# Patient Record
Sex: Male | Born: 1965 | Race: Black or African American | Hispanic: No | Marital: Single | Smoking: Current every day smoker
Health system: Southern US, Community
[De-identification: ages and names within clinical notes are randomized; demographics above are authoritative.]

## PROBLEM LIST (undated history)

## (undated) DIAGNOSIS — I10 Essential (primary) hypertension: Secondary | ICD-10-CM

---

## 1993-11-04 DIAGNOSIS — S62109A Fracture of unspecified carpal bone, unspecified wrist, initial encounter for closed fracture: Secondary | ICD-10-CM

## 1993-11-04 HISTORY — DX: Fracture of unspecified carpal bone, unspecified wrist, initial encounter for closed fracture: S62.109A

## 2021-09-30 ENCOUNTER — Emergency Department (HOSPITAL_COMMUNITY)
Admission: EM | Admit: 2021-09-30 | Discharge: 2021-10-01 | Disposition: A | Discharge status: Home / Self Care | Payer: Self-pay | Attending: Emergency Medicine | Admitting: Emergency Medicine

## 2021-09-30 DIAGNOSIS — Y9355 Activity, bike riding: Secondary | ICD-10-CM | POA: Insufficient documentation

## 2021-09-30 DIAGNOSIS — S8991XA Unspecified injury of right lower leg, initial encounter: Secondary | ICD-10-CM | POA: Insufficient documentation

## 2021-09-30 DIAGNOSIS — S7011XA Contusion of right thigh, initial encounter: Secondary | ICD-10-CM | POA: Insufficient documentation

## 2021-09-30 DIAGNOSIS — Y92410 Unspecified street and highway as the place of occurrence of the external cause: Secondary | ICD-10-CM | POA: Insufficient documentation

## 2021-10-01 ENCOUNTER — Encounter (HOSPITAL_COMMUNITY): Payer: Self-pay | Admitting: *Deleted

## 2021-10-01 ENCOUNTER — Other Ambulatory Visit: Payer: Self-pay

## 2021-10-01 NOTE — Discharge Instructions (Signed)
Take ibuprofen for pain as needed. Cool compresses may help soreness.
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
## 2021-10-01 NOTE — ED Notes (Signed)
The pt walked out without any assistance or diffficulty

## 2021-12-12 ENCOUNTER — Encounter (HOSPITAL_BASED_OUTPATIENT_CLINIC_OR_DEPARTMENT_OTHER): Payer: Self-pay | Admitting: *Deleted

## 2021-12-12 ENCOUNTER — Emergency Department (HOSPITAL_COMMUNITY)
Admission: EM | Admit: 2021-12-12 | Discharge: 2021-12-12 | Disposition: A | Discharge status: Home / Self Care | Payer: Self-pay | Attending: Emergency Medicine | Admitting: Emergency Medicine

## 2021-12-12 ENCOUNTER — Other Ambulatory Visit: Payer: Self-pay

## 2021-12-12 DIAGNOSIS — S90821A Blister (nonthermal), right foot, initial encounter: Secondary | ICD-10-CM | POA: Insufficient documentation

## 2021-12-12 DIAGNOSIS — R238 Other skin changes: Secondary | ICD-10-CM | POA: Insufficient documentation

## 2021-12-12 DIAGNOSIS — Y9301 Activity, walking, marching and hiking: Secondary | ICD-10-CM | POA: Insufficient documentation

## 2021-12-12 DIAGNOSIS — X58XXXA Exposure to other specified factors, initial encounter: Secondary | ICD-10-CM | POA: Insufficient documentation

## 2021-12-12 DIAGNOSIS — S90822A Blister (nonthermal), left foot, initial encounter: Secondary | ICD-10-CM | POA: Insufficient documentation
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
## 2021-12-12 NOTE — ED Provider Notes (Signed)
°  [facility] Emergency Department Provider Note MRN:  [id number]  Arrival date & time: 12/12/21     Chief Complaint   Blister   History of Present Illness   Charles Patterson is a 56 y.o. year-old male presents to the ED with chief complaint of blisters on the soles of his feet.  He denies hx of DM.  States that he has been walking a lot lately.  Reports that they are painful.  Denies successful treatments PTA.    Review of Systems  Pertinent review of systems noted in HPI.    Physical Exam   Vitals:   12/12/21 0127  BP: (!) 119/46  Pulse: 86  Resp: 18  Temp: 98.9 F (37.2 C)  SpO2: 100%    CONSTITUTIONAL:  well-appearing, NAD NEURO:  Alert and oriented x 3, CN 3-12 grossly intact EYES:  eyes equal and reactive ENT/NECK:  Supple, no stridor  CARDIO:  normal rate, appears well-perfused  PULM:  No respiratory distress,  GI/GU:  non-distended,  MSK/SPINE:  No gross deformities, no edema, moves all extremities  SKIN:  no rash, atraumatic, 2 penny sized blisters, one on each foot on the ball of the foot near the distal 3rd metatarsal   *Additional and/or pertinent findings included in MDM below  Diagnostic and Interventional Summary    EKG Interpretation  Date/Time:    Ventricular Rate:    PR Interval:    QRS Duration:   QT Interval:    QTC Calculation:   R Axis:     Text Interpretation:         Labs Reviewed - No data to display  No orders to display    Medications - No data to display   Procedures  /  Critical Care Procedures  ED Course and Medical Decision Making  I have reviewed the triage vital signs, the nursing notes, and pertinent available records from the EMR.  Complexity of Problems Addressed Acute uncomplicated illness or injury with no diagnostics  Additional Data Reviewed and Analyzed Further history obtained from: Past medical history and medications listed in the EMR    ED Course    Patient here with  2 small blisters on his feet.  They are intact.  There is no sign of infection.  There is mild pain.  Do not feel that patient needs further emergent workup tonight.    Final Clinical Impressions(s) / ED Diagnoses     ICD-10-CM   1. Blisters of multiple sites  R23.8       ED Discharge Orders     None        Discharge Instructions Discussed with and Provided to Patient:   Discharge Instructions   None      Montine Circle, PA-C 12/12/21 0146    Merrily Pew, MD 12/12/21 [phone]

## 2021-12-12 NOTE — ED Triage Notes (Signed)
Pt c/o blisters on soles of feet. Ambulatory to triage, NAD.

## 2021-12-13 ENCOUNTER — Emergency Department (HOSPITAL_BASED_OUTPATIENT_CLINIC_OR_DEPARTMENT_OTHER)
Admission: EM | Admit: 2021-12-13 | Discharge: 2021-12-13 | Disposition: A | Discharge status: Home / Self Care | Payer: Self-pay | Attending: Emergency Medicine | Admitting: Emergency Medicine

## 2021-12-13 DIAGNOSIS — S90829A Blister (nonthermal), unspecified foot, initial encounter: Secondary | ICD-10-CM

## 2021-12-13 NOTE — ED Provider Notes (Signed)
°  [facility] EMERGENCY DEPARTMENT Provider Note   CSN: [id number] Arrival date & time: 12/12/21  2330     History  Chief Complaint  Patient presents with   Blister    Charles Patterson is a 56 y.o. male.  Patient is a 56 year old male by EMS for evaluation of blisters on his feet.  He reports walking more than normal.  Patient was seen at [facility] this morning for the same.  Patient has little additional history as he is quite somnolent.  He was apparently singing and yelling in the waiting room prior to being brought back to the exam room.  The history is provided by the patient.      Home Medications Prior to Admission medications   Not on File      Allergies    Patient has no allergy information on record.    Review of Systems   Review of Systems  All other systems reviewed and are negative.  Physical Exam Updated Vital Signs BP (!) 153/110 (BP Location: Right Arm)    Pulse (!) 108    Temp 98.2 F (36.8 C) (Oral)    Resp 18    Ht 5\' 4"  (1.626 m)    Wt 72.6 kg    SpO2 100%    BMI 27.46 kg/m  Physical Exam Vitals and nursing note reviewed.  Constitutional:      General: He is not in acute distress.    Appearance: Normal appearance. He is not ill-appearing.  HENT:     Head: Normocephalic and atraumatic.  Pulmonary:     Effort: Pulmonary effort is normal.  Musculoskeletal:     Comments: There are areas of blistering to the bottom of both feet, each measures approximately 1 cm x 2 cm.  There is no redness or erythema.  There is no purulent drainage.  Skin:    General: Skin is warm and dry.  Neurological:     General: No focal deficit present.     Mental Status: He is alert and oriented to person, place, and time.    ED Results / Procedures / Treatments   Labs (all labs ordered are listed, but only abnormal results are displayed) Labs Reviewed - No data to display  EKG None  Radiology No results found.  Procedures Procedures     Medications Ordered in ED Medications - No data to display  ED Course/ Medical Decision Making/ A&P  Patient by EMS with blisters to the bottom of both feet.  Patient advised to pad his feet and soak as much as possible.  I see no indication for any further treatment.  Final Clinical Impression(s) / ED Diagnoses Final diagnoses:  None    Rx / DC Orders ED Discharge Orders     None         , MD 12/13/21 0111

## 2021-12-13 NOTE — Discharge Instructions (Signed)
Soak your feet as frequently as possible and warm water.  Wear comfortable shoes and socks and pad appropriately.

## 2021-12-23 ENCOUNTER — Other Ambulatory Visit: Payer: Self-pay

## 2021-12-23 ENCOUNTER — Emergency Department (HOSPITAL_COMMUNITY): Payer: Self-pay

## 2021-12-23 ENCOUNTER — Encounter (HOSPITAL_COMMUNITY): Payer: Self-pay

## 2021-12-23 ENCOUNTER — Emergency Department (HOSPITAL_COMMUNITY)
Admission: EM | Admit: 2021-12-23 | Discharge: 2021-12-23 | Discharge status: Against Medical Advice | Payer: Self-pay | Attending: Emergency Medicine | Admitting: Emergency Medicine

## 2021-12-23 DIAGNOSIS — M25531 Pain in right wrist: Secondary | ICD-10-CM | POA: Insufficient documentation

## 2021-12-23 DIAGNOSIS — M542 Cervicalgia: Secondary | ICD-10-CM | POA: Insufficient documentation

## 2021-12-23 DIAGNOSIS — S0990XA Unspecified injury of head, initial encounter: Secondary | ICD-10-CM | POA: Insufficient documentation

## 2021-12-23 DIAGNOSIS — M25539 Pain in unspecified wrist: Secondary | ICD-10-CM

## 2021-12-23 DIAGNOSIS — S0993XA Unspecified injury of face, initial encounter: Secondary | ICD-10-CM | POA: Insufficient documentation
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
## 2021-12-23 NOTE — Discharge Instructions (Signed)
Regarding your wrist pain, recommend following up with the orthopedic surgeon.  Keep splint on for now.  If you develop increased swelling, redness, or other new concerning symptom, come back to ER for reassessment.

## 2021-12-23 NOTE — ED Notes (Signed)
Patient transported to CT 
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
## 2021-12-24 NOTE — ED Provider Notes (Signed)
This patient was signed out to me at shift change however the nurse informed me he left before getting his imaging studies. Unfortunately I was unable to discuss this with him while I was involved in another patient's care. I did not see this patient personally.    Pollyann Savoy, MD 12/24/21 [phone]

## 2022-02-25 ENCOUNTER — Other Ambulatory Visit: Payer: Self-pay

## 2022-02-25 ENCOUNTER — Emergency Department (HOSPITAL_COMMUNITY)
Admission: EM | Admit: 2022-02-25 | Discharge: 2022-02-25 | Disposition: A | Discharge status: Home / Self Care | Payer: No Typology Code available for payment source | Attending: Emergency Medicine | Admitting: Emergency Medicine

## 2022-02-25 ENCOUNTER — Encounter (HOSPITAL_COMMUNITY): Payer: Self-pay | Admitting: Emergency Medicine

## 2022-02-25 DIAGNOSIS — M542 Cervicalgia: Secondary | ICD-10-CM | POA: Insufficient documentation

## 2022-02-25 DIAGNOSIS — M25512 Pain in left shoulder: Secondary | ICD-10-CM | POA: Insufficient documentation

## 2022-02-25 DIAGNOSIS — Y92410 Unspecified street and highway as the place of occurrence of the external cause: Secondary | ICD-10-CM | POA: Diagnosis not present

## 2022-02-25 MED ORDER — ACETAMINOPHEN 500 MG PO TABS
1000.0000 mg | ORAL_TABLET | Freq: Once | ORAL | Status: AC
  Administered 2022-02-25: 1000 mg via ORAL
  Filled 2022-02-25: qty 2

## 2022-02-25 MED ORDER — LIDOCAINE 5 % EX PTCH
1.0000 | MEDICATED_PATCH | CUTANEOUS | Status: DC
  Administered 2022-02-25: 1 via TRANSDERMAL
  Filled 2022-02-25 (×2): qty 1

## 2022-02-25 MED ORDER — LIDOCAINE 4 % EX PTCH: 1.0000 | MEDICATED_PATCH | Freq: Two times a day (BID) | CUTANEOUS | 0 refills | Status: DC | PRN
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
## 2022-02-25 NOTE — ED Notes (Signed)
Discharge paperwork reviewed with the patient. The patient verbalized understanding of instructions. Patient discharged. ?
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
## 2022-07-14 ENCOUNTER — Encounter (HOSPITAL_COMMUNITY): Payer: Self-pay | Admitting: Emergency Medicine

## 2022-07-14 ENCOUNTER — Emergency Department (HOSPITAL_COMMUNITY): Payer: No Typology Code available for payment source

## 2022-07-14 ENCOUNTER — Other Ambulatory Visit: Payer: Self-pay

## 2022-07-14 ENCOUNTER — Emergency Department (HOSPITAL_COMMUNITY)
Admission: EM | Admit: 2022-07-14 | Discharge: 2022-07-15 | Disposition: A | Discharge status: Home / Self Care | Payer: No Typology Code available for payment source | Attending: Emergency Medicine | Admitting: Emergency Medicine

## 2022-07-14 DIAGNOSIS — M79671 Pain in right foot: Secondary | ICD-10-CM

## 2022-07-14 DIAGNOSIS — Y9389 Activity, other specified: Secondary | ICD-10-CM | POA: Diagnosis not present

## 2022-07-14 DIAGNOSIS — W228XXA Striking against or struck by other objects, initial encounter: Secondary | ICD-10-CM | POA: Diagnosis not present

## 2022-07-14 DIAGNOSIS — S90121A Contusion of right lesser toe(s) without damage to nail, initial encounter: Secondary | ICD-10-CM | POA: Insufficient documentation

## 2022-07-14 DIAGNOSIS — S99921A Unspecified injury of right foot, initial encounter: Secondary | ICD-10-CM | POA: Diagnosis present

## 2022-07-14 NOTE — ED Provider Triage Note (Signed)
Emergency Medicine Provider Triage Evaluation Note  ROGUE RAFALSKI , a 56 y.o. male  was evaluated in triage.  Pt complains of right foot injury.  States he was trying to hop over a fence and smacked his foot.  He complains of pain along lateral right foot and 5th toe.  Remains ambulatory.  Review of Systems  Positive: Foot injury Negative: fever  Physical Exam  BP (!) 145/88   Pulse 79   Temp 97.7 F (36.5 C) (Oral)   Resp 16   SpO2 98%  Gen:   Awake, no distress   Resp:  Normal effort  MSK:   Moves extremities without difficulty  Other:  Ambulatory in triage  Medical Decision Making  Medically screening exam initiated at 10:30 PM.  Appropriate orders placed.  Alfonso Ramus was informed that the remainder of the evaluation will be completed by another provider, this initial triage assessment does not replace that evaluation, and the importance of remaining in the ED until their evaluation is complete.  Foot injury.  X-ray ordered.   Garlon Hatchet, PA-C 07/14/22 2231

## 2022-07-14 NOTE — ED Triage Notes (Signed)
Pt c/o right foot pain after hitting it.  Reports it hurts when he walks and after he sits for a while.

## 2022-07-15 NOTE — ED Provider Notes (Signed)
[facility] Emergency Department Provider Note MRN:  [id number]  Arrival date & time: 07/15/22     Chief Complaint   Foot Pain   History of Present Illness   Charles KANAAN is a 56 y.o. year-old male presents to the ED with chief complaint of right-sided foot pain.  He states that he was attempting to swing his leg over a concrete barrier and hit the side of his foot on the top of the barrier.  He reports some bruising near the base of his small toe on the right foot.  He is concerned for fracture.  He denies any treatments prior to arrival.  Symptoms are worsened with palpation and movement.  History provided by patient.   Review of Systems  Pertinent positive and negative review of systems noted in HPI.    Physical Exam   Vitals:   07/14/22 2226 07/15/22 0527  BP: (!) 145/88 (!) 144/95  Pulse: 79 77  Resp: 16 14  Temp: 97.7 F (36.5 C) 97.8 F (36.6 C)  SpO2: 98% 95%    CONSTITUTIONAL:  well-appearing, NAD NEURO:  Alert and oriented x 3, CN 3-12 grossly intact EYES:  eyes equal and reactive ENT/NECK:  Supple, no stridor  CARDIO:  appears well-perfused  PULM:  No respiratory distress,  GI/GU:  non-distended,  MSK/SPINE:  No gross deformities, no edema, moves all extremities, mild swelling about the right 5th toe  SKIN:  no rash, atraumatic, contusion to right lateral 5th toe   *Additional and/or pertinent findings included in MDM below  Diagnostic and Interventional Summary    EKG Interpretation  Date/Time:    Ventricular Rate:    PR Interval:    QRS Duration:   QT Interval:    QTC Calculation:   R Axis:     Text Interpretation:         Labs Reviewed - No data to display  DG Foot Complete Right  Final Result      Medications - No data to display   Procedures  /  Critical Care Procedures  ED Course and Medical Decision Making  I have reviewed the triage vital signs, the nursing notes, and pertinent available records  from the EMR.  Social Determinants Affecting Complexity of Care: Patient has no clinically significant social determinants affecting this chief complaint..   ED Course:    Medical Decision Making Patient here with injury to his right foot.  He has some mild swelling about the right fifth toe laterally, no open wounds, mild contusions present, plain films are negative.  We will give postop shoe for comfort.  Recommend outpatient follow-up.  Amount and/or Complexity of Data Reviewed Radiology: independent interpretation performed.    Details: No fractures seen     Consultants: No consultations were needed in caring for this patient.   Treatment and Plan: Emergency department workup does not suggest an emergent condition requiring admission or immediate intervention beyond  what has been performed at this time. The patient is safe for discharge and has  been instructed to return immediately for worsening symptoms, change in  symptoms or any other concerns    Final Clinical Impressions(s) / ED Diagnoses     ICD-10-CM   1. Foot pain, right  M79.671       ED Discharge Orders     None         Discharge Instructions Discussed with and Provided to Patient:   Discharge Instructions   None  Roxy Horseman, PA-C 07/15/22 2500    Geoffery Lyons, MD 07/15/22 [phone]

## 2022-07-22 ENCOUNTER — Encounter (HOSPITAL_COMMUNITY): Payer: Self-pay | Admitting: Emergency Medicine

## 2022-07-22 ENCOUNTER — Other Ambulatory Visit: Payer: Self-pay

## 2022-07-22 ENCOUNTER — Emergency Department (HOSPITAL_COMMUNITY)
Admission: EM | Admit: 2022-07-22 | Discharge: 2022-07-22 | Disposition: A | Discharge status: Home / Self Care | Payer: No Typology Code available for payment source | Attending: Emergency Medicine | Admitting: Emergency Medicine

## 2022-07-22 DIAGNOSIS — R42 Dizziness and giddiness: Secondary | ICD-10-CM | POA: Diagnosis present

## 2022-07-22 DIAGNOSIS — I10 Essential (primary) hypertension: Secondary | ICD-10-CM | POA: Insufficient documentation

## 2022-07-22 LAB — CBC
HCT: 40.4 % (ref 39.0–52.0)
Hemoglobin: 14.2 g/dL (ref 13.0–17.0)
MCH: 34.1 pg — ABNORMAL HIGH (ref 26.0–34.0)
MCHC: 35.1 g/dL (ref 30.0–36.0)
MCV: 97.1 fL (ref 80.0–100.0)
Platelets: 249 10*3/uL (ref 150–400)
RBC: 4.16 MIL/uL — ABNORMAL LOW (ref 4.22–5.81)
RDW: 13.2 % (ref 11.5–15.5)
WBC: 4.1 10*3/uL (ref 4.0–10.5)
nRBC: 0 % (ref 0.0–0.2)

## 2022-07-22 LAB — BASIC METABOLIC PANEL
Anion gap: 7 (ref 5–15)
BUN: 11 mg/dL (ref 6–20)
CO2: 26 mmol/L (ref 22–32)
Calcium: 9.1 mg/dL (ref 8.9–10.3)
Chloride: 105 mmol/L (ref 98–111)
Creatinine, Ser: 1.14 mg/dL (ref 0.61–1.24)
GFR, Estimated: >60 mL/min (ref >60)
Glucose, Bld: 114 mg/dL — ABNORMAL HIGH (ref 70–99)
Potassium: 3.5 mmol/L (ref 3.5–5.1)
Sodium: 138 mmol/L (ref 135–145)

## 2022-07-22 NOTE — ED Provider Notes (Signed)
[facility] EMERGENCY DEPARTMENT Provider Note   CSN: [id number] Arrival date & time: 07/22/22  0413     History  Chief Complaint  Patient presents with   Hypertension   Dizziness    Charles Patterson is a 56 y.o. male who presents to the ED after episode of lightheadedness while exercising this morning around 3 AM.  Patient states that he struggled with insomnia, ate dinner and 11 PM, slept for few hours and then woke up and decided to go exercise.  He did not eat anything prior to leaving his home and drink a large coffee and a bit of caffeinated soda.  States while he was working out he began to feel somewhat lightheaded though he did not experience any chest pain shortness of breath or palpitations.  He did not syncopized.  He is feeling better at this time after eating some chips and drinking more soda in route to the hospital.  No history of cardiac event in the past.  I personally reviewed his medical record 3 does not carry medical diagnoses nor standing medications daily.  HPI     Home Medications Prior to Admission medications   Medication Sig Start Date End Date Taking? Authorizing Provider  lidocaine (HM LIDOCAINE PATCH) 4 % Place 1 patch onto the skin every 12 (twelve) hours as needed. Patient not taking: Reported on 07/14/2022 02/25/22   Haskel Schroeder, PA-C      Allergies    Patient has no known allergies.    Review of Systems   Review of Systems  Neurological:  Positive for light-headedness.  All other systems reviewed and are negative.   Physical Exam Updated Vital Signs BP 118/76   Pulse 96   Temp 99.2 F (37.3 C) (Oral)   Resp 18   Ht 5\' 7"  (1.702 m)   Wt 77.1 kg   SpO2 95%   BMI 26.63 kg/m  Physical Exam Vitals and nursing note reviewed.  Constitutional:      Appearance: He is not ill-appearing or toxic-appearing.  HENT:     Head: Normocephalic and atraumatic.     Mouth/Throat:     Mouth: Mucous membranes are moist.      Pharynx: No oropharyngeal exudate or posterior oropharyngeal erythema.  Eyes:     General:        Right eye: No discharge.        Left eye: No discharge.     Conjunctiva/sclera: Conjunctivae normal.  Cardiovascular:     Rate and Rhythm: Normal rate and regular rhythm.     Pulses: Normal pulses.     Heart sounds: Normal heart sounds. No murmur heard. Pulmonary:     Effort: Pulmonary effort is normal. No respiratory distress.     Breath sounds: Normal breath sounds. No wheezing or rales.  Abdominal:     General: Bowel sounds are normal. There is no distension.     Palpations: Abdomen is soft.     Tenderness: There is no abdominal tenderness. There is no guarding or rebound.  Musculoskeletal:        General: No deformity.     Cervical back: Neck supple.     Right lower leg: No edema.     Left lower leg: No edema.  Skin:    General: Skin is warm and dry.     Capillary Refill: Capillary refill takes less than 2 seconds.  Neurological:     General: No focal deficit present.  Mental Status: He is alert and oriented to person, place, and time. Mental status is at baseline.  Psychiatric:        Mood and Affect: Mood normal.     ED Results / Procedures / Treatments   Labs (all labs ordered are listed, but only abnormal results are displayed) Labs Reviewed  BASIC METABOLIC PANEL - Abnormal; Notable for the following components:      Result Value   Glucose, Bld 114 (*)    All other components within normal limits  CBC - Abnormal; Notable for the following components:   RBC 4.16 (*)    MCH 34.1 (*)    All other components within normal limits  CBG MONITORING, ED    EKG EKG Interpretation  Date/Time:  Monday July 22 2022 04:27:59 EDT Ventricular Rate:  87 PR Interval:  156 QRS Duration: 76 QT Interval:  358 QTC Calculation: 430 R Axis:   40 Text Interpretation: Normal sinus rhythm Normal ECG No previous ECGs available Confirmed by Addison Lank ([id number]) on  07/22/2022 5:27:28 AM  Radiology No results found.  Procedures Procedures  Medications Ordered in ED Medications - No data to display  ED Course/ Medical Decision Making/ A&P                           Medical Decision Making 56 year old male presents for episode of lightheadedness now resolved.  Vital signs are normal and intake.  Cardiopulmonary exam is normal, abdominal exam is benign.  Patient is neurovascular intact in all extremities, ambulatory in ED and very energetic.  Tolerating p.o.  Differential diagnosis includes but limited to dysrhythmia, drug effect, hypoglycemia, orthostasis.   Amount and/or Complexity of Data Reviewed Labs: ordered.    Details: CBC unremarkable, BMP unremarkable   Work-up and physical exam very reassuring with normal vital signs and EKG at this time.  Clinical concern for emergent etiology of his symptomatology warrant further ED work-up or inpatient management is exceedingly low.  Suspect likely combination of lack of intake of food and increased caffeine intake prior to exercising at a time when he typically sleeping that led to his presenting symptoms.  Ladarrious  voiced understanding of her medical evaluation and treatment plan. Each of their questions answered to their expressed satisfaction.  Return precautions were given.  Patient is well-appearing, stable, and was discharged in good condition.  This chart was dictated using voice recognition software, Dragon. Despite the best efforts of this provider to proofread and correct errors, errors may still occur which can change documentation meaning.    Final Clinical Impression(s) / ED Diagnoses Final diagnoses:  Lightheadedness    Rx / DC Orders ED Discharge Orders     None         Aura Dials 07/22/22 [place], MD 07/22/22 [phone]

## 2022-07-22 NOTE — ED Triage Notes (Signed)
Pt reports he was exercising this morning but he feels his blood pressure is higher b/c he started to feel lightheaded.  He is eating chips and drinking soda in triage.

## 2022-07-22 NOTE — Discharge Instructions (Signed)
Patient here today for your episode of lightheadedness while working out.  Suspect is related to your lack of nutrition and intake of large amounts of caffeine than typical for you prior to working out.  Your physical exam and work-up are reassuring at this time and you are safe to be discharged home.  Please help with your primary care doctor return to the ER with any new severe symptoms.

## 2022-07-30 ENCOUNTER — Emergency Department (HOSPITAL_COMMUNITY)
Admission: EM | Admit: 2022-07-30 | Discharge: 2022-07-31 | Disposition: A | Discharge status: Home / Self Care | Payer: No Typology Code available for payment source | Attending: Emergency Medicine | Admitting: Emergency Medicine

## 2022-07-30 DIAGNOSIS — Z5321 Procedure and treatment not carried out due to patient leaving prior to being seen by health care provider: Secondary | ICD-10-CM | POA: Diagnosis not present

## 2022-07-30 DIAGNOSIS — M79671 Pain in right foot: Secondary | ICD-10-CM | POA: Diagnosis not present

## 2022-07-31 ENCOUNTER — Emergency Department (HOSPITAL_COMMUNITY)
Admission: EM | Admit: 2022-07-31 | Discharge: 2022-08-01 | Disposition: A | Discharge status: Home / Self Care | Payer: No Typology Code available for payment source | Source: Home / Self Care | Attending: Emergency Medicine | Admitting: Emergency Medicine

## 2022-07-31 ENCOUNTER — Encounter (HOSPITAL_COMMUNITY): Payer: Self-pay | Admitting: Emergency Medicine

## 2022-07-31 ENCOUNTER — Other Ambulatory Visit: Payer: Self-pay

## 2022-07-31 DIAGNOSIS — M79671 Pain in right foot: Secondary | ICD-10-CM | POA: Insufficient documentation

## 2022-07-31 NOTE — ED Triage Notes (Signed)
Patient here for re-evaluation of pain in R heel. States he feels like needles are sticking in his heel. Having pain with standing and walking. Denies any recent trauma. Not a diabetic.

## 2022-07-31 NOTE — ED Provider Triage Note (Signed)
Emergency Medicine Provider Triage Evaluation Note  Charles Patterson , a 56 y.o. male  was evaluated in triage.  Pt complains of right heel pain.  States he injured it earlier this month and still having pain.  Had previous x-ray that was normal.  Denies new injury/trauma.  Review of Systems  Positive: Right heel pain Negative: fever  Physical Exam  BP (!) 156/97 (BP Location: Right Arm)   Pulse 77   Temp 98.2 F (36.8 C) (Oral)   Resp 18   SpO2 100%  Gen:   Awake, no distress   Resp:  Normal effort  MSK:   Moves extremities without difficulty  Other:  Ambulatory   Medical Decision Making  Medically screening exam initiated at 12:02 AM.  Appropriate orders placed.  Rudean Hitt was informed that the remainder of the evaluation will be completed by another provider, this initial triage assessment does not replace that evaluation, and the importance of remaining in the ED until their evaluation is complete.  Right heel pain.  X-ray 07/14/22 without acute findings.  Denies new injury/trauma.  Remains ambulatory in triage.   Larene Pickett, PA-C 07/31/22 0005

## 2022-07-31 NOTE — ED Triage Notes (Signed)
Patient here with right heel pain.  States that it has been going on today. Having pain with walking.

## 2022-08-01 ENCOUNTER — Emergency Department (HOSPITAL_COMMUNITY)
Admission: EM | Admit: 2022-08-01 | Discharge: 2022-08-02 | Discharge status: Against Medical Advice | Payer: No Typology Code available for payment source | Attending: Emergency Medicine | Admitting: Emergency Medicine

## 2022-08-01 ENCOUNTER — Other Ambulatory Visit: Payer: Self-pay

## 2022-08-01 ENCOUNTER — Encounter (HOSPITAL_COMMUNITY): Payer: Self-pay | Admitting: Emergency Medicine

## 2022-08-01 DIAGNOSIS — W458XXA Other foreign body or object entering through skin, initial encounter: Secondary | ICD-10-CM | POA: Diagnosis not present

## 2022-08-01 DIAGNOSIS — S60551A Superficial foreign body of right hand, initial encounter: Secondary | ICD-10-CM | POA: Diagnosis not present

## 2022-08-01 NOTE — ED Provider Notes (Signed)
[facility] EMERGENCY DEPARTMENT Provider Note   CSN: [id number] Arrival date & time: 07/31/22  2313     History  Chief Complaint  Patient presents with   Foot Pain    Charles Patterson is a 56 y.o. male who presents with concern for persistent right heel pain.  Patient was seen in the ER yesterday in triage for the same.  Has had ongoing complaint of this pain and has had normal x-rays in the past.  No change today.  Patient also states he has not doing anything at home to help with his pain.  No over-the-counter medications or topical therapies.  No numbness tingling or weakness in the feet, no new injury.  No swelling or bruising to the area.  I personally His medical records.  He does not carry medical understanding medications daily.  He does have 7 visits to the ED in the last month for various complaints.  HPI     Home Medications Prior to Admission medications   Medication Sig Start Date End Date Taking? Authorizing Provider  lidocaine (HM LIDOCAINE PATCH) 4 % Place 1 patch onto the skin every 12 (twelve) hours as needed. Patient not taking: Reported on 07/14/2022 02/25/22   Loni Beckwith, PA-C      Allergies    Patient has no known allergies.    Review of Systems   Review of Systems  Musculoskeletal:        Right heel pain    Physical Exam Updated Vital Signs BP (!) 141/86   Pulse 71   Temp 98 F (36.7 C) (Oral)   Resp 18   SpO2 99%  Physical Exam Vitals and nursing note reviewed.  Constitutional:      Appearance: He is not toxic-appearing.  HENT:     Head: Normocephalic and atraumatic.  Eyes:     General: No scleral icterus.       Right eye: No discharge.        Left eye: No discharge.     Conjunctiva/sclera: Conjunctivae normal.  Pulmonary:     Effort: Pulmonary effort is normal.  Musculoskeletal:       Feet:  Skin:    General: Skin is warm and dry.  Neurological:     General: No focal deficit present.     Mental  Status: He is alert.     Gait: Gait is intact.     Comments: Patient ambulatory without difficulty in the emergency department, walking around triage making conversation with ED staff.   Psychiatric:        Mood and Affect: Mood normal.     ED Results / Procedures / Treatments   Labs (all labs ordered are listed, but only abnormal results are displayed) Labs Reviewed - No data to display  EKG None  Radiology No results found.  Procedures Procedures    Medications Ordered in ED Medications - No data to display  ED Course/ Medical Decision Making/ A&P                           Medical Decision Making Patient with ongoing right heel pain after reported injury over a month ago.  Hypertensive on intake, vital signs otherwise normal.  Patient neurovascularly intact in bilateral lower extremities.  Tenderness palpation over the plantar surface of the heel on the right without evidence of skin changes or acute traumatic injury.  Repeat x-ray offered to the patient, but he  declined.   Encourage patient to utilize available resources such as topical analgesia, oral analgesia or heat/ice at home for his discomfort.  May also use compressive wrap as necessary and will provide follow-up in patient for outpatient for reevaluation of his pain.  No evidence of acute injury or emergent etiology would warrant further ED work-up or inpatient management in the ER today.  Oral pain relief offered, patient declined as he states he does not like to take medications.  No further work-up warranted in ER at this time.  [name] voiced understanding of his medical evaluation and treatment plan. Each of their questions answered to their expressed satisfaction.  Return precautions were given.  Patient is well-appearing, stable, and was discharged in good condition.  This chart was dictated using voice recognition software, Dragon. Despite the best efforts of this provider to proofread and correct errors,  errors may still occur which can change documentation meaning.     Final Clinical Impression(s) / ED Diagnoses Final diagnoses:  Right foot pain    Rx / DC Orders ED Discharge Orders     None         Aura Dials 08/01/22 [id number]    Quintella Reichert, MD 08/01/22 0730

## 2022-08-01 NOTE — Discharge Instructions (Addendum)
You are seen here today for your foot pain.  You had x-rays that were normal in the past.  We would recommend that you use ice to the area, you can rest it, and you may use over-the-counter medication such as Tylenol or ibuprofen.  He may follow-up with the orthopedic specialist listed below and return to the ER with any new severe symptoms.

## 2022-08-01 NOTE — ED Triage Notes (Signed)
Pt reported to ED stating that he has a splinter and rt hand and states that he is overdue for a tetanus shot, stating he last had one last year. Pt notified by this RN that tetanus shots are good for at least five years. No other complaints at this time.

## 2022-08-02 NOTE — ED Notes (Signed)
Patient called to treatment room no answer

## 2022-08-06 DIAGNOSIS — R42 Dizziness and giddiness: Secondary | ICD-10-CM | POA: Insufficient documentation

## 2022-08-06 DIAGNOSIS — R0789 Other chest pain: Secondary | ICD-10-CM | POA: Insufficient documentation

## 2022-08-07 ENCOUNTER — Encounter (HOSPITAL_COMMUNITY): Payer: Self-pay | Admitting: Emergency Medicine

## 2022-08-07 ENCOUNTER — Emergency Department (HOSPITAL_COMMUNITY): Payer: No Typology Code available for payment source

## 2022-08-07 ENCOUNTER — Other Ambulatory Visit: Payer: Self-pay

## 2022-08-07 ENCOUNTER — Emergency Department (HOSPITAL_COMMUNITY)
Admission: EM | Admit: 2022-08-07 | Discharge: 2022-08-07 | Disposition: A | Discharge status: Home / Self Care | Payer: No Typology Code available for payment source | Attending: Emergency Medicine | Admitting: Emergency Medicine

## 2022-08-07 DIAGNOSIS — R0789 Other chest pain: Secondary | ICD-10-CM

## 2022-08-07 LAB — BASIC METABOLIC PANEL
Anion gap: 8 (ref 5–15)
BUN: 9 mg/dL (ref 6–20)
CO2: 26 mmol/L (ref 22–32)
Calcium: 9.1 mg/dL (ref 8.9–10.3)
Chloride: 103 mmol/L (ref 98–111)
Creatinine, Ser: 1.08 mg/dL (ref 0.61–1.24)
GFR, Estimated: >60 mL/min (ref >60)
Glucose, Bld: 103 mg/dL — ABNORMAL HIGH (ref 70–99)
Potassium: 3.8 mmol/L (ref 3.5–5.1)
Sodium: 137 mmol/L (ref 135–145)

## 2022-08-07 LAB — TROPONIN I (HIGH SENSITIVITY)
Troponin I (High Sensitivity): 6 ng/L (ref <18)
Troponin I (High Sensitivity): 5 ng/L (ref <18)

## 2022-08-07 LAB — CBC
HCT: 40.3 % (ref 39.0–52.0)
Hemoglobin: 14.3 g/dL (ref 13.0–17.0)
MCH: 34.1 pg — ABNORMAL HIGH (ref 26.0–34.0)
MCHC: 35.5 g/dL (ref 30.0–36.0)
MCV: 96.2 fL (ref 80.0–100.0)
Platelets: 251 10*3/uL (ref 150–400)
RBC: 4.19 MIL/uL — ABNORMAL LOW (ref 4.22–5.81)
RDW: 12.5 % (ref 11.5–15.5)
WBC: 5.6 10*3/uL (ref 4.0–10.5)
nRBC: 0 % (ref 0.0–0.2)

## 2022-08-07 NOTE — Discharge Instructions (Addendum)
Your tests were all reassuring, there was no evidence of damage to your heart.  Your blood pressure is a little high.  I recommend that you check this with your doctor.  You should also avoid fatty and greasy foods.  Your symptoms might of been related to acid reflux.

## 2022-08-07 NOTE — ED Provider Triage Note (Signed)
  Emergency Medicine Provider Triage Evaluation Note  MRN:  [id number]  Arrival date & time: 08/07/22    Medically screening exam initiated at 12:24 AM.   CC:   Chest Pain   HPI:  Charles Patterson is a 56 y.o. year-old male presents to the ED with chief complaint of chest pain and lightheadedness that happened around 9:30pm.  States that the symptoms lasted about 5 minutes.  Denies any recent illness.  Not having any symptoms now.  History provided by patient. ROS:  -As included in HPI PE:   Vitals:   08/07/22 0020  BP: (!) 162/103  Pulse: 86  Resp: 17  Temp: 98.4 F (36.9 C)  SpO2: 97%    Non-toxic appearing No respiratory distress  MDM:   I've ordered labs in triage to expedite lab/diagnostic workup.  Patient was informed that the remainder of the evaluation will be completed by another provider, this initial triage assessment does not replace that evaluation, and the importance of remaining in the ED until their evaluation is complete.    Montine Circle, PA-C 08/07/22 [phone]

## 2022-08-07 NOTE — ED Provider Notes (Signed)
[facility] Emergency Department Provider Note MRN:  [id number]  Arrival date & time: 08/07/22     Chief Complaint   Chest Pain   History of Present Illness   Charles Patterson is a 56 y.o. year-old male presents to the ED with chief complaint of chest pain.  Symptoms started about 9:30 PM and lasted for approximately 5 minutes.  He states that he felt slightly lightheaded when the symptoms initially occurred.  He states that the symptoms have now completely resolved.  He he states that he has been eating fried foods this week, and thinks that it might be acid reflux, but wanted to make certain it was not his heart..  History provided by patient.   Review of Systems  Pertinent positive and negative review of systems noted in HPI.    Physical Exam   Vitals:   08/07/22 0020 08/07/22 0245  BP: (!) 162/103 (!) 159/99  Pulse: 86 72  Resp: 17 18  Temp: 98.4 F (36.9 C) 97.9 F (36.6 C)  SpO2: 97% 98%    CONSTITUTIONAL:  well-appearing, NAD NEURO:  Alert and oriented x 3, CN 3-12 grossly intact EYES:  eyes equal and reactive ENT/NECK:  Supple, no stridor  CARDIO:  normal rate, regular rhythm, appears well-perfused  PULM:  No respiratory distress,  GI/GU:  non-distended,  MSK/SPINE:  No gross deformities, no edema, moves all extremities  SKIN:  no rash, atraumatic   *Additional and/or pertinent findings included in MDM below  Diagnostic and Interventional Summary    EKG Interpretation  Date/Time:  Wednesday August 07 2022 00:24:33 EDT Ventricular Rate:  80 PR Interval:  152 QRS Duration: 78 QT Interval:  362 QTC Calculation: 417 R Axis:   65 Text Interpretation: Normal sinus rhythm Cannot rule out Anterior infarct , age undetermined Abnormal ECG When compared with ECG of 22-Jul-2022 04:27, PREVIOUS ECG IS PRESENT Confirmed by Ross Marcus ([id number]) on 08/07/2022 3:45:13 AM       Labs Reviewed  BASIC METABOLIC PANEL - Abnormal; Notable  for the following components:      Result Value   Glucose, Bld 103 (*)    All other components within normal limits  CBC - Abnormal; Notable for the following components:   RBC 4.19 (*)    MCH 34.1 (*)    All other components within normal limits  TROPONIN I (HIGH SENSITIVITY)  TROPONIN I (HIGH SENSITIVITY)    DG Chest 2 View  Final Result      Medications - No data to display   Procedures  /  Critical Care Procedures  ED Course and Medical Decision Making  I have reviewed the triage vital signs, the nursing notes, and pertinent available records from the EMR.  Social Determinants Affecting Complexity of Care: Patient has no clinically significant social determinants affecting this chief complaint..   ED Course:    Medical Decision Making Patient here with chest pain that started about 9:30 PM and lasted for approximately 5 minutes.  Not having any symptoms now.  We will check labs, EKG, and chest x-ray.  Troponins negative x2.  Chest x-ray negative.  EKG shows no ischemic changes.  Based on resolution and brief episode of symptoms, I feel that patient can be safely discharged with close outpatient follow-up.  Return precautions discussed.  Amount and/or Complexity of Data Reviewed Labs: ordered.    Details: Troponins negative x2, no significant electrolyte derangement Radiology: ordered and independent interpretation performed.    Details: No  visible opacity ECG/medicine tests: ordered and independent interpretation performed.    Details: No acute ischemic changes     Consultants: No consultations were needed in caring for this patient.   Treatment and Plan: I considered admission due to patient's initial presentation, but after considering the examination and diagnostic results, patient will not require admission and can be discharged with outpatient follow-up.    Final Clinical Impressions(s) / ED Diagnoses     ICD-10-CM   1. Other chest pain  R07.89        ED Discharge Orders     None         Discharge Instructions Discussed with and Provided to Patient:     Discharge Instructions      Your tests were all reassuring, there was no evidence of damage to your heart.  Your blood pressure is a little high.  I recommend that you check this with your doctor.  You should also avoid fatty and greasy foods.  Your symptoms might of been related to acid reflux.       Montine Circle, PA-C 08/07/22 [id number]    Merryl Hacker, MD 08/08/22 [phone]

## 2022-08-07 NOTE — ED Triage Notes (Signed)
Patient reports central chest pain with SOB this evening , no emesis or diaphoresis .

## 2022-08-11 ENCOUNTER — Emergency Department (HOSPITAL_COMMUNITY)
Admission: EM | Admit: 2022-08-11 | Discharge: 2022-08-11 | Discharge status: Against Medical Advice | Payer: No Typology Code available for payment source | Attending: Emergency Medicine | Admitting: Emergency Medicine

## 2022-08-11 ENCOUNTER — Encounter (HOSPITAL_COMMUNITY): Payer: Self-pay | Admitting: Emergency Medicine

## 2022-08-11 ENCOUNTER — Other Ambulatory Visit: Payer: Self-pay

## 2022-08-11 ENCOUNTER — Emergency Department (HOSPITAL_COMMUNITY)
Admission: EM | Admit: 2022-08-11 | Discharge: 2022-08-11 | Disposition: A | Discharge status: Home / Self Care | Payer: No Typology Code available for payment source | Attending: Emergency Medicine | Admitting: Emergency Medicine

## 2022-08-11 DIAGNOSIS — G8929 Other chronic pain: Secondary | ICD-10-CM

## 2022-08-11 DIAGNOSIS — M25562 Pain in left knee: Secondary | ICD-10-CM | POA: Insufficient documentation

## 2022-08-11 DIAGNOSIS — Z5321 Procedure and treatment not carried out due to patient leaving prior to being seen by health care provider: Secondary | ICD-10-CM | POA: Diagnosis not present

## 2022-08-11 NOTE — ED Triage Notes (Signed)
Pt reported to ED with c/o left knee pain. States it began throbbing with the onset of rain this evening. Denies injury or trauma to knee.

## 2022-08-11 NOTE — Discharge Instructions (Signed)
Use the brace as needed.  Follow-up with orthopedics in the office in the next 2 weeks.  Return to the ED for fevers, inability to flex extend the knee, new concerning symptoms.

## 2022-08-11 NOTE — ED Notes (Signed)
Pt ambulatory to triage.

## 2022-08-11 NOTE — ED Provider Notes (Signed)
[facility] EMERGENCY DEPARTMENT Provider Note   CSN: [id number] Arrival date & time: 08/11/22  1958     History  No chief complaint on file.   Charles Patterson is a 56 y.o. male.  HPI   Patient presents due to left knee pain.  This is going on for multiple years and is been told by orthopedics needs a knee replacement.  States he deals with the pain chronically, it is worsened yesterday after the rain.  He denies any fevers or chills, he is able to move it without significant pain or swelling.  Denies any recent falls or trauma, no additional symptoms.  States he is here for a knee immobilizer, states he does not want any repeat imaging because he has had it done previously and "it does not show anything".  Home Medications Prior to Admission medications   Medication Sig Start Date End Date Taking? Authorizing Provider  lidocaine (HM LIDOCAINE PATCH) 4 % Place 1 patch onto the skin every 12 (twelve) hours as needed. Patient not taking: Reported on 07/14/2022 02/25/22   Loni Beckwith, PA-C      Allergies    Patient has no known allergies.    Review of Systems   Review of Systems  Physical Exam Updated Vital Signs There were no vitals taken for this visit. Physical Exam Vitals and nursing note reviewed. Exam conducted with a chaperone present.  Constitutional:      General: He is not in acute distress.    Appearance: Normal appearance.  HENT:     Head: Normocephalic and atraumatic.  Eyes:     General: No scleral icterus.    Extraocular Movements: Extraocular movements intact.     Pupils: Pupils are equal, round, and reactive to light.  Cardiovascular:     Pulses: Normal pulses.  Musculoskeletal:        General: Tenderness present. No swelling.     Comments: Tolerates passive ROM to the left knee.  No bursitis appreciated, no tenderness over the tibial plateau.  He does have some pain with flexion extension of the knee.  Skin:    Capillary  Refill: Capillary refill takes less than 2 seconds.     Coloration: Skin is not jaundiced.  Neurological:     Mental Status: He is alert. Mental status is at baseline.     Coordination: Coordination normal.     ED Results / Procedures / Treatments   Labs (all labs ordered are listed, but only abnormal results are displayed) Labs Reviewed - No data to display  EKG None  Radiology No results found.  Procedures Procedures    Medications Ordered in ED Medications - No data to display  ED Course/ Medical Decision Making/ A&P                           Medical Decision Making  Patient presents due to left knee pain.  On exam he tolerates passive ROM, there is no erythema or swelling.  Not consistent with septic joint, he is neurovascular intact with brisk refill and pulses.  No signs of ischemic limb, compartments are soft so I do not think it is a compartment syndrome.  No signs of DVT, no calf tenderness.  No crepitus on exam.  Given atraumatic I do not think repeat imaging is necessarily indicated and patient declines anyway.  We will send patient home with knee brace as requested.  I provided orthopedic follow-up  as well strict return precautions.        Final Clinical Impression(s) / ED Diagnoses Final diagnoses:  None    Rx / DC Orders ED Discharge Orders     None         Sherrill Raring, Hershal Coria 08/11/22 2258    Blanchie Dessert, MD 08/15/22 1523

## 2022-08-11 NOTE — ED Notes (Signed)
Pt left due to not being seen quick enough 

## 2022-08-11 NOTE — ED Triage Notes (Signed)
Pt here for L knee pain, states he has no cartilage in his knee and when he stands up he has "popcorn" in his knee. Pt denies injury or trauma.

## 2022-08-12 ENCOUNTER — Emergency Department (HOSPITAL_COMMUNITY)
Admission: EM | Admit: 2022-08-12 | Discharge: 2022-08-12 | Discharge status: Against Medical Advice | Payer: No Typology Code available for payment source | Attending: Emergency Medicine | Admitting: Emergency Medicine

## 2022-08-12 ENCOUNTER — Other Ambulatory Visit: Payer: Self-pay

## 2022-08-12 ENCOUNTER — Encounter (HOSPITAL_COMMUNITY): Payer: Self-pay | Admitting: *Deleted

## 2022-08-12 DIAGNOSIS — M25561 Pain in right knee: Secondary | ICD-10-CM | POA: Diagnosis present

## 2022-08-12 DIAGNOSIS — Z5321 Procedure and treatment not carried out due to patient leaving prior to being seen by health care provider: Secondary | ICD-10-CM | POA: Insufficient documentation

## 2022-08-12 NOTE — ED Triage Notes (Signed)
C/o right knee pain onset 1 month ago, denies injury. Ambulatory without difficulty.

## 2022-08-12 NOTE — ED Notes (Signed)
PATIENT LEFT AMA 

## 2022-08-14 ENCOUNTER — Emergency Department (HOSPITAL_COMMUNITY)
Admission: EM | Admit: 2022-08-14 | Discharge: 2022-08-15 | Discharge status: Against Medical Advice | Payer: No Typology Code available for payment source | Attending: Emergency Medicine | Admitting: Emergency Medicine

## 2022-08-14 ENCOUNTER — Encounter (HOSPITAL_COMMUNITY): Payer: Self-pay | Admitting: Emergency Medicine

## 2022-08-14 ENCOUNTER — Other Ambulatory Visit: Payer: Self-pay

## 2022-08-14 DIAGNOSIS — Z5321 Procedure and treatment not carried out due to patient leaving prior to being seen by health care provider: Secondary | ICD-10-CM | POA: Diagnosis not present

## 2022-08-14 DIAGNOSIS — R519 Headache, unspecified: Secondary | ICD-10-CM | POA: Diagnosis present

## 2022-08-14 NOTE — ED Triage Notes (Signed)
Pt reported to ED with c/o headache that is now resolved after taking a nap.

## 2022-08-14 NOTE — ED Provider Triage Note (Signed)
Emergency Medicine Provider Triage Evaluation Note  Charles Patterson , a 56 y.o. male  was evaluated in triage.  Pt complains of headache that started earlier today. Frontal/temporal, gradual onset, steady progression, tried tylenol without much relief. Has had similar headaches in the past.  Review of Systems  Positive: headache Negative: Change in vision, numbness, weakness, vomiting  Physical Exam  There were no vitals taken for this visit. Gen:   Awake, no distress   Resp:  Normal effort  MSK:   Moves extremities without difficulty  Other:  PERRL. No facial droop. Symmetric grip strength. Ambulatory.   Medical Decision Making  Medically screening exam initiated at 11:22 PM.  Appropriate orders placed.  Rudean Hitt was informed that the remainder of the evaluation will be completed by another provider, this initial triage assessment does not replace that evaluation, and the importance of remaining in the ED until their evaluation is complete.  Headache.    Amaryllis Dyke, [place] 08/15/22 2206

## 2022-08-15 NOTE — ED Notes (Signed)
PATIENT STAYS AN REST AND LEAVES AT THIS  TIME EVERY MORNING.LEFT AMA

## 2022-08-17 ENCOUNTER — Other Ambulatory Visit: Payer: Self-pay

## 2022-08-17 ENCOUNTER — Emergency Department (HOSPITAL_COMMUNITY): Payer: No Typology Code available for payment source

## 2022-08-17 ENCOUNTER — Encounter (HOSPITAL_COMMUNITY): Payer: Self-pay

## 2022-08-17 ENCOUNTER — Emergency Department (HOSPITAL_COMMUNITY)
Admission: EM | Admit: 2022-08-17 | Discharge: 2022-08-17 | Disposition: A | Discharge status: Home / Self Care | Payer: No Typology Code available for payment source | Attending: Emergency Medicine | Admitting: Emergency Medicine

## 2022-08-17 DIAGNOSIS — Y93B2 Activity, push-ups, pull-ups, sit-ups: Secondary | ICD-10-CM | POA: Diagnosis not present

## 2022-08-17 DIAGNOSIS — S63502A Unspecified sprain of left wrist, initial encounter: Secondary | ICD-10-CM

## 2022-08-17 DIAGNOSIS — X501XXA Overexertion from prolonged static or awkward postures, initial encounter: Secondary | ICD-10-CM | POA: Diagnosis not present

## 2022-08-17 DIAGNOSIS — M25532 Pain in left wrist: Secondary | ICD-10-CM | POA: Diagnosis present

## 2022-08-17 NOTE — ED Triage Notes (Signed)
Pt reports he was doing some pull ups when he heard something pop in his left wrist and it is now painful. + radial pulse and + sensation. No visible deformity.

## 2022-08-17 NOTE — Progress Notes (Signed)
Orthopedic Tech Progress Note Patient Details:  Charles Patterson 12-16-1965 [id number]  Ortho Devices Type of Ortho Device: Velcro wrist splint Ortho Device/Splint Location: Left wrist Ortho Device/Splint Interventions: Application   Post Interventions Patient Tolerated: Well  Linus Salmons Ayo Smoak 08/17/2022, 8:54 PM

## 2022-08-17 NOTE — ED Provider Notes (Signed)
[facility] EMERGENCY DEPARTMENT Provider Note   CSN: [id number] Arrival date & time: 08/17/22  1635     History  Chief Complaint  Patient presents with   Wrist Pain    Charles Patterson is a 56 y.o. male.  56 year old right-hand-dominant male presents with complaint of pain in his left wrist along the distal ulna.  Patient states that he was doing pull-ups today when he felt something pop in his wrist.  Reports prior fracture, concern for injury.       Home Medications Prior to Admission medications   Medication Sig Start Date End Date Taking? Authorizing Provider  lidocaine (HM LIDOCAINE PATCH) 4 % Place 1 patch onto the skin every 12 (twelve) hours as needed. Patient not taking: Reported on 07/14/2022 02/25/22   Haskel Schroeder, PA-C      Allergies    Patient has no known allergies.    Review of Systems   Review of Systems Negative except as per HPI Physical Exam Updated Vital Signs BP (!) 149/89 (BP Location: Right Arm)   Pulse 93   Temp 98.6 F (37 C) (Oral)   Resp 16   Ht 5\' 7"  (1.702 m)   Wt 77.1 kg   SpO2 98%   BMI 26.63 kg/m  Physical Exam Vitals and nursing note reviewed.  Constitutional:      General: He is not in acute distress.    Appearance: He is well-developed. He is not diaphoretic.  HENT:     Head: Normocephalic and atraumatic.  Cardiovascular:     Pulses: Normal pulses.  Pulmonary:     Effort: Pulmonary effort is normal.  Musculoskeletal:        General: Tenderness present. No swelling or deformity. Normal range of motion.     Comments: Mild tenderness to the ulnar styloid, no swelling, no crepitus.  Skin:    General: Skin is warm and dry.     Findings: No erythema or rash.  Neurological:     Mental Status: He is alert and oriented to person, place, and time.     Sensory: No sensory deficit.     Motor: No weakness.  Psychiatric:        Behavior: Behavior normal.     ED Results / Procedures / Treatments    Labs (all labs ordered are listed, but only abnormal results are displayed) Labs Reviewed - No data to display  EKG None  Radiology DG Wrist Complete Left  Result Date: 08/17/2022 CLINICAL DATA:  Injury, pain while doing pull-ups EXAM: LEFT WRIST - COMPLETE 3+ VIEW COMPARISON:  None Available. FINDINGS: There is no evidence of fracture or dislocation. There is no evidence of arthropathy or other focal bone abnormality. Very superficial, tiny rounded calcification in the dorsal soft tissues over the carpus measuring no greater than 0.2 cm, seen on lateral view. Soft tissues are otherwise unremarkable. IMPRESSION: 1. No fracture or dislocation of the left wrist. 2. Very superficial, tiny rounded calcification in the dorsal soft tissues over the carpus measuring no greater than 0.2 cm, seen on lateral view. This appears to be in the subcutaneous soft tissues, most likely too superficial to represent an avulsion fracture. Electronically Signed   By: 08/19/2022 M.D.   On: 08/17/2022 17:27    Procedures Procedures    Medications Ordered in ED Medications - No data to display  ED Course/ Medical Decision Making/ A&P  Medical Decision Making Amount and/or Complexity of Data Reviewed Radiology: ordered.   56 year old male with concern for left wrist injury when he felt a pop while doing a pull-up today.  He has normal range of motion of the wrist, radial pulse present, brisk cap refill to fingers.  X-ray of the left wrist as ordered in triage, interpreted by myself is negative for acute bony injury.  Agree with radiologist interpretation, question if the tiny rounded calcification is related to prior since prior fracture.  Plan is for Velcro splint to left wrist.  Can ice, elevate, Motrin Tylenol as needed and follow-up with orthopedics if not improving        Final Clinical Impression(s) / ED Diagnoses Final diagnoses:  Sprain of left wrist, initial  encounter    Rx / DC Orders ED Discharge Orders     None         Roque Lias 08/17/22 2222    Noemi Chapel, MD 08/20/22 1214

## 2022-08-17 NOTE — Care Management (Signed)
12 ED visits in the last 6 months. Information provided on AVS to establish a PCP.

## 2022-08-23 ENCOUNTER — Encounter (HOSPITAL_COMMUNITY): Payer: Self-pay

## 2022-08-23 ENCOUNTER — Emergency Department (HOSPITAL_COMMUNITY): Payer: No Typology Code available for payment source

## 2022-08-23 ENCOUNTER — Other Ambulatory Visit: Payer: Self-pay

## 2022-08-23 ENCOUNTER — Emergency Department (HOSPITAL_COMMUNITY)
Admission: EM | Admit: 2022-08-23 | Discharge: 2022-08-23 | Disposition: A | Discharge status: Home / Self Care | Payer: No Typology Code available for payment source | Attending: Emergency Medicine | Admitting: Emergency Medicine

## 2022-08-23 DIAGNOSIS — S63502A Unspecified sprain of left wrist, initial encounter: Secondary | ICD-10-CM | POA: Diagnosis not present

## 2022-08-23 DIAGNOSIS — W19XXXA Unspecified fall, initial encounter: Secondary | ICD-10-CM | POA: Insufficient documentation

## 2022-08-23 DIAGNOSIS — S6992XA Unspecified injury of left wrist, hand and finger(s), initial encounter: Secondary | ICD-10-CM | POA: Diagnosis present

## 2022-08-23 NOTE — ED Triage Notes (Signed)
Pt c/o left wrist pain x 7 hours. Pt states he slipped and tried to hold himself with left hand.

## 2022-08-23 NOTE — ED Provider Triage Note (Signed)
  Emergency Medicine Provider Triage Evaluation Note  MRN:  [id number]  Arrival date & time: 08/23/22    Medically screening exam initiated at 12:41 AM.   CC:   Wrist Pain   HPI:  Charles Patterson is a 56 y.o. year-old male presents to the ED with chief complaint of left wrist pain.  States that he fell and felt a pop.  Hx of fracture of the same wrist.  History provided by [facility] ROS:  -As included in HPI PE:   Vitals:   08/23/22 0036  BP: 117/88  Pulse: 85  Resp: 17  Temp: 98 F (36.7 C)  SpO2: 96%    Non-toxic appearing No respiratory distress Norm ROM of the left wrist, but painful MDM:  Based on signs and symptoms, sprain is highest on my differential, followed by fx. I've ordered imaging in triage to expedite lab/diagnostic workup.  Patient was informed that the remainder of the evaluation will be completed by another provider, this initial triage assessment does not replace that evaluation, and the importance of remaining in the ED until their evaluation is complete.    Montine Circle, PA-C 08/23/22 [phone]

## 2022-08-23 NOTE — ED Provider Notes (Signed)
  [facility] Emergency Department Provider Note MRN:  [id number]  Arrival date & time: 08/23/22     Chief Complaint   Wrist Pain   History of Present Illness   Charles Patterson is a 56 y.o. year-old male presents to the ED with chief complaint of left wrist pain.  States that he recently fell and felt something pop in his wrist.  States that he was doing a push-up and felt another pop today.  He has a history of remote fracture in the wrist.  He states that he is able to move it, but it is painful.  He denies any treatment prior to arrival.  History provided by patient.   Review of Systems  Pertinent positive and negative review of systems noted in HPI.    Physical Exam   Vitals:   08/23/22 0036  BP: 117/88  Pulse: 85  Resp: 17  Temp: 98 F (36.7 C)  SpO2: 96%    CONSTITUTIONAL:  well-appearing, NAD NEURO:  Alert and oriented x 3, CN 3-12 grossly intact EYES:  eyes equal and reactive ENT/NECK:  Supple, no stridor  CARDIO: appears well-perfused  PULM:  No respiratory distress,  GI/GU:  non-distended,  MSK/SPINE:  No gross deformities, no edema, moves all extremities, normal ROM of the right wrist, no palpable deformity SKIN:  no rash, atraumatic   *Additional and/or pertinent findings included in MDM below  Diagnostic and Interventional Summary    EKG Interpretation  Date/Time:    Ventricular Rate:    PR Interval:    QRS Duration:   QT Interval:    QTC Calculation:   R Axis:     Text Interpretation:         Labs Reviewed - No data to display  DG Wrist Complete Left  Final Result      Medications - No data to display   Procedures  /  Critical Care Procedures  ED Course and Medical Decision Making  I have reviewed the triage vital signs, the nursing notes, and pertinent available records from the EMR.  Social Determinants Affecting Complexity of Care: Patient has no clinically significant social determinants affecting  this chief complaint..   ED Course:    Medical Decision Making Patient here with left wrist injury.  Will check plain films.  X-ray negative for fracture.  Will give wrist splint.  RecommendOTC Tylenol/Motrin.  Recommend Ortho follow-up if not improving.  The  Amount and/or Complexity of Data Reviewed Radiology: ordered and independent interpretation performed.    Details: No fracture seen     Consultants: No consultations were needed in caring for this patient.   Treatment and Plan: Emergency department workup does not suggest an emergent condition requiring admission or immediate intervention beyond  what has been performed at this time. The patient is safe for discharge and has  been instructed to return immediately for worsening symptoms, change in  symptoms or any other concerns    Final Clinical Impressions(s) / ED Diagnoses     ICD-10-CM   1. Sprain of left wrist, initial encounter  G25.427C       ED Discharge Orders     None         Discharge Instructions Discussed with and Provided to Patient:   Discharge Instructions   None      Montine Circle, PA-C 08/23/22 [place], [place], DO 08/23/22 [phone]

## 2022-08-26 ENCOUNTER — Other Ambulatory Visit: Payer: Self-pay

## 2022-08-26 ENCOUNTER — Encounter (HOSPITAL_COMMUNITY): Payer: Self-pay | Admitting: *Deleted

## 2022-08-26 ENCOUNTER — Emergency Department (HOSPITAL_COMMUNITY)
Admission: EM | Admit: 2022-08-26 | Discharge: 2022-08-26 | Disposition: A | Discharge status: Home / Self Care | Payer: No Typology Code available for payment source | Attending: Emergency Medicine | Admitting: Emergency Medicine

## 2022-08-26 DIAGNOSIS — R29898 Other symptoms and signs involving the musculoskeletal system: Secondary | ICD-10-CM | POA: Insufficient documentation

## 2022-08-26 DIAGNOSIS — M238X2 Other internal derangements of left knee: Secondary | ICD-10-CM

## 2022-08-26 DIAGNOSIS — M25562 Pain in left knee: Secondary | ICD-10-CM | POA: Diagnosis present

## 2022-08-26 NOTE — ED Triage Notes (Signed)
Pt ambulatory to triage c/o left knee pain. Pt says he does not have any cartilage in his knee and this causes him pain. Denies injury.

## 2022-08-26 NOTE — ED Provider Notes (Signed)
[facility] EMERGENCY DEPARTMENT Provider Note  CSN: [id number] Arrival date & time: 08/26/22 0132  Chief Complaint(s) Knee Pain  HPI Charles Patterson is a 56 y.o. male with ongoing left knee pain for "a while."  Reports hearing cracks every time he bends his knee like popcorn.  Denies any fall or trauma.  States that he was given a brace that does not stay on.  Denies any other physical complaints.  The history is provided by the patient.    Past Medical History Past Medical History:  Diagnosis Date   Wrist fracture 1995   left   There are no problems to display for this patient.  Home Medication(s) Prior to Admission medications   Medication Sig Start Date End Date Taking? Authorizing Provider  lidocaine (HM LIDOCAINE PATCH) 4 % Place 1 patch onto the skin every 12 (twelve) hours as needed. Patient not taking: Reported on 07/14/2022 02/25/22   Haskel Schroeder, PA-C                                                                                                                                    Allergies Patient has no known allergies.  Review of Systems Review of Systems As noted in HPI  Physical Exam Vital Signs  I have reviewed the triage vital signs BP (!) 163/94 (BP Location: Right Arm)   Pulse (!) 105   Temp 98.9 F (37.2 C) (Oral)   Resp 17   SpO2 94%   Physical Exam Vitals reviewed.  Constitutional:      General: He is not in acute distress.    Appearance: He is well-developed. He is not diaphoretic.  HENT:     Head: Normocephalic and atraumatic.     Right Ear: External ear normal.     Left Ear: External ear normal.     Nose: Nose normal.     Mouth/Throat:     Mouth: Mucous membranes are moist.  Eyes:     General: No scleral icterus.    Conjunctiva/sclera: Conjunctivae normal.  Neck:     Trachea: Phonation normal.  Cardiovascular:     Rate and Rhythm: Normal rate and regular rhythm.  Pulmonary:     Effort: Pulmonary  effort is normal. No respiratory distress.     Breath sounds: No stridor.  Abdominal:     General: There is no distension.  Musculoskeletal:        General: Normal range of motion.     Cervical back: Normal range of motion.     Left knee: Crepitus (mild) present. No swelling, deformity or bony tenderness. Normal range of motion. No tenderness. No LCL laxity, MCL laxity, ACL laxity or PCL laxity.Normal alignment.  Neurological:     Mental Status: He is alert and oriented to person, place, and time.  Psychiatric:        Behavior: Behavior normal.     ED Results  and Treatments Labs (all labs ordered are listed, but only abnormal results are displayed) Labs Reviewed - No data to display                                                                                                                       EKG  EKG Interpretation  Date/Time:    Ventricular Rate:    PR Interval:    QRS Duration:   QT Interval:    QTC Calculation:   R Axis:     Text Interpretation:         Radiology No results found.  Medications Ordered in ED Medications - No data to display                                                                                                                                   Procedures Procedures  (including critical care time)  Medical Decision Making / ED Course   Medical Decision Making Amount and/or Complexity of Data Reviewed External Data Reviewed: radiology.    Details: Xray from OSH in June 2023 notable for mild degenerative changes and enthesophytes of the patella and tibial tuberosity.     Chronic atraumatic knee pain. Not concerning for septic arthritis. No imaging needed. Provided with sleeve.      Final Clinical Impression(s) / ED Diagnoses Final diagnoses:  Crepitus of joint of left knee   The patient appears reasonably screened and/or stabilized for discharge and I doubt any other medical condition or other [facility] requiring further  screening, evaluation, or treatment in the ED at this time. I have discussed the findings, Dx and Tx plan with the patient/family who expressed understanding and agree(s) with the plan. Discharge instructions discussed at length. The patient/family was given strict return precautions who verbalized understanding of the instructions. No further questions at time of discharge.  Disposition: Discharge  Condition: Good  ED Discharge Orders     None        Follow Up: Primary care provider  Call  to schedule an appointment for close follow up           This chart was dictated using voice recognition software.  Despite best efforts to proofread,  errors can occur which can change the documentation meaning.    Fatima Blank, MD 08/26/22 [phone]

## 2022-08-26 NOTE — ED Notes (Signed)
PATIENT HAS RECEIVED THE KNEE SLEEVE .HAS BEEN DISCHARGE

## 2022-10-19 ENCOUNTER — Emergency Department (HOSPITAL_COMMUNITY): Payer: No Typology Code available for payment source

## 2022-10-19 ENCOUNTER — Emergency Department (HOSPITAL_COMMUNITY)
Admission: EM | Admit: 2022-10-19 | Discharge: 2022-10-20 | Disposition: A | Discharge status: Home / Self Care | Payer: No Typology Code available for payment source | Attending: Emergency Medicine | Admitting: Emergency Medicine

## 2022-10-19 ENCOUNTER — Encounter (HOSPITAL_COMMUNITY): Payer: Self-pay

## 2022-10-19 ENCOUNTER — Other Ambulatory Visit: Payer: Self-pay

## 2022-10-19 DIAGNOSIS — M542 Cervicalgia: Secondary | ICD-10-CM | POA: Insufficient documentation

## 2022-10-19 DIAGNOSIS — X501XXA Overexertion from prolonged static or awkward postures, initial encounter: Secondary | ICD-10-CM | POA: Insufficient documentation

## 2022-10-19 DIAGNOSIS — Y9389 Activity, other specified: Secondary | ICD-10-CM | POA: Diagnosis not present

## 2022-10-19 DIAGNOSIS — R202 Paresthesia of skin: Secondary | ICD-10-CM | POA: Insufficient documentation

## 2022-10-19 DIAGNOSIS — M25562 Pain in left knee: Secondary | ICD-10-CM | POA: Insufficient documentation

## 2022-10-19 DIAGNOSIS — G8929 Other chronic pain: Secondary | ICD-10-CM | POA: Diagnosis not present

## 2022-10-19 DIAGNOSIS — M79602 Pain in left arm: Secondary | ICD-10-CM | POA: Insufficient documentation

## 2022-10-19 DIAGNOSIS — M792 Neuralgia and neuritis, unspecified: Secondary | ICD-10-CM

## 2022-10-19 DIAGNOSIS — M25511 Pain in right shoulder: Secondary | ICD-10-CM | POA: Diagnosis not present

## 2022-10-19 NOTE — ED Provider Triage Note (Signed)
Emergency Medicine Provider Triage Evaluation Note  Charles Patterson , a 56 y.o. male  was evaluated in triage.  Pt complains of left shoulder pain since earlier this evening. 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  Was playing pool earlier tonight, when he bent his left arm back and fell like he was being stabbed in the back of his left shoulder.  Intermittent numbness in the left hand.  Review of Systems  Positive: Left shoulder pain Negative:   Physical Exam  BP (!) 162/94 (BP Location: Right Arm)   Pulse (!) 101   Temp 98.5 F (36.9 C) (Oral)   Resp 16   Ht 5\' 7"  (1.702 m)   Wt 83 kg   SpO2 99%   BMI 28.66 kg/m  Gen:   Awake, no distress   Resp:  Normal effort  MSK:   Moves extremities without difficulty  Other:  Decreased ROM of the left shoulder to abduction and flexion due to pain, normal ROM of the left elbow, normal grip strength, normal sensation  Medical Decision Making  Medically screening exam initiated at 11:04 PM.  Appropriate orders placed.  was informed that the remainder of the evaluation will be completed by another provider, this initial triage assessment does not replace that evaluation, and the importance of remaining in the ED until their evaluation is complete.  Discussed with the patient that we will obtain basic imaging, but he would likely need an MRI to rule out rotator cuff pathology.  He has not seen an orthopedist in several years, but I recommended that he see 1 after his visit today.  If he is not referred to 1 during this visit, he plans to follow-up with his PCP to talk about it.   Alfonso Ramus, PA-C 10/19/22 2306

## 2022-10-19 NOTE — ED Triage Notes (Signed)
Patient sates that he has issues with his router cuff a couple of years ago, he states he decided against surgical intervention, states that tonight he was playing pool and felt a sharp pain in his back, with intermittent right hand numbness

## 2022-10-20 ENCOUNTER — Other Ambulatory Visit: Payer: Self-pay

## 2022-10-20 ENCOUNTER — Emergency Department (HOSPITAL_COMMUNITY)
Admission: EM | Admit: 2022-10-20 | Discharge: 2022-10-20 | Disposition: A | Discharge status: Home / Self Care | Payer: No Typology Code available for payment source | Source: Home / Self Care | Attending: Emergency Medicine | Admitting: Emergency Medicine

## 2022-10-20 ENCOUNTER — Emergency Department (HOSPITAL_COMMUNITY): Payer: No Typology Code available for payment source

## 2022-10-20 ENCOUNTER — Encounter (HOSPITAL_COMMUNITY): Payer: Self-pay

## 2022-10-20 DIAGNOSIS — M25562 Pain in left knee: Secondary | ICD-10-CM | POA: Insufficient documentation

## 2022-10-20 DIAGNOSIS — G8929 Other chronic pain: Secondary | ICD-10-CM | POA: Insufficient documentation

## 2022-10-20 MED ORDER — METHYLPREDNISOLONE 4 MG PO TBPK: ORAL_TABLET | ORAL | 0 refills | Status: DC

## 2022-10-20 MED ORDER — KETOROLAC TROMETHAMINE 15 MG/ML IJ SOLN
15.0000 mg | Freq: Once | INTRAMUSCULAR | Status: AC
  Administered 2022-10-20: 15 mg via INTRAMUSCULAR
  Filled 2022-10-20: qty 1

## 2022-10-20 NOTE — ED Provider Triage Note (Signed)
Emergency Medicine Provider Triage Evaluation Note  Charles Patterson , a 56 y.o. male  was evaluated in triage.  Pt complains of left knee pain, acute on chronic for around 1 week. Has tried epsom salts, denies any ibuprofen, tylenol.  Review of Systems  Positive: Knee pain Negative: Numbness, tingling  Physical Exam  BP 125/86   Pulse 86   Temp 98.1 F (36.7 C)   Resp 16   SpO2 95%  Gen:   Awake, no distress   Resp:  Normal effort  MSK:   Moves extremities without difficulty  Other:  Normal range of motion of left knee without swelling, effusion.  No step-off, deformity noted, some crepitus and laxity of patella without displacement.  Medical Decision Making  Medically screening exam initiated at 6:29 PM.  Appropriate orders placed.  Alfonso Ramus was informed that the remainder of the evaluation will be completed by another provider, this initial triage assessment does not replace that evaluation, and the importance of remaining in the ED until their evaluation is complete.  Workup initiated   Olene Floss, [place] 10/20/22 1831

## 2022-10-20 NOTE — Discharge Instructions (Addendum)
You were seen today for ongoing left shoulder pain.  You may have some nerve impingement but also given your history of rotator cuff injury, this could be culprit.  Take medications as prescribed.  Follow-up with orthopedics.

## 2022-10-20 NOTE — ED Triage Notes (Signed)
Pt arrived POV w/ c/o acute on chronic L knee pain and hasn't tried any home therapies for relief.

## 2022-10-20 NOTE — ED Provider Notes (Signed)
[facility] EMERGENCY DEPARTMENT Provider Note   CSN: [id number] Arrival date & time: 10/20/22  1706     History  Chief Complaint  Patient presents with   Knee Pain    Charles Patterson is a 56 y.o. male.  55 year old male presents with recurrent left knee pain.  Denies any history of trauma.  Pain is characterized as dull and does improve with ambulation.  Denies any fever or chills.  No other joint pain.  Patient seen in ED multiple times for difficult plaints over the last couple weeks.       Home Medications Prior to Admission medications   Medication Sig Start Date End Date Taking? Authorizing Provider  lidocaine (HM LIDOCAINE PATCH) 4 % Place 1 patch onto the skin every 12 (twelve) hours as needed. Patient not taking: Reported on 07/14/2022 02/25/22   Haskel Schroeder, PA-C  methylPREDNISolone (MEDROL DOSEPAK) 4 MG TBPK tablet Take as directed on packet 10/20/22   Horton, Mayer Masker, MD      Allergies    Patient has no known allergies.    Review of Systems   Review of Systems  All other systems reviewed and are negative.   Physical Exam Updated Vital Signs BP 125/86   Pulse 86   Temp 98.1 F (36.7 C)   Resp 16   Ht 1.702 m (5\' 7" )   Wt 83 kg   SpO2 95%   BMI 28.66 kg/m  Physical Exam Vitals and nursing note reviewed.  Constitutional:      General: He is not in acute distress.    Appearance: Normal appearance. He is well-developed. He is not toxic-appearing.  HENT:     Head: Normocephalic and atraumatic.  Eyes:     General: Lids are normal.     Conjunctiva/sclera: Conjunctivae normal.     Pupils: Pupils are equal, round, and reactive to light.  Neck:     Thyroid: No thyroid mass.     Trachea: No tracheal deviation.  Cardiovascular:     Rate and Rhythm: Normal rate and regular rhythm.     Heart sounds: Normal heart sounds. No murmur heard.    No gallop.  Pulmonary:     Effort: Pulmonary effort is normal. No respiratory  distress.     Breath sounds: Normal breath sounds. No stridor. No decreased breath sounds, wheezing, rhonchi or rales.  Abdominal:     General: There is no distension.     Palpations: Abdomen is soft.     Tenderness: There is no abdominal tenderness. There is no rebound.  Musculoskeletal:        General: No tenderness. Normal range of motion.     Cervical back: Normal range of motion and neck supple.     Left knee: Normal range of motion. No tenderness.  Skin:    General: Skin is warm and dry.     Findings: No abrasion or rash.  Neurological:     Mental Status: He is alert and oriented to person, place, and time. Mental status is at baseline.     GCS: GCS eye subscore is 4. GCS verbal subscore is 5. GCS motor subscore is 6.     Cranial Nerves: No cranial nerve deficit.     Sensory: No sensory deficit.     Motor: Motor function is intact.  Psychiatric:        Attention and Perception: Attention normal.        Speech: Speech normal.  Behavior: Behavior normal.     ED Results / Procedures / Treatments   Labs (all labs ordered are listed, but only abnormal results are displayed) Labs Reviewed - No data to display  EKG None  Radiology DG Knee Complete 4 Views Left  Result Date: 10/20/2022 CLINICAL DATA:  Knee pain, acute on chronic for 1 week. EXAM: LEFT KNEE - COMPLETE 4+ VIEW COMPARISON:  None Available. FINDINGS: No evidence of fracture, dislocation, or joint effusion. No evidence of significant arthropathy or other focal bone abnormality. Soft tissues are unremarkable. IMPRESSION: Negative. Electronically Signed   By: Ileana Roup M.D.   On: 10/20/2022 19:25   DG Shoulder Left  Result Date: 10/19/2022 CLINICAL DATA:  Left shoulder pain since earlier this evening EXAM: LEFT SHOULDER - 2+ VIEW COMPARISON:  None Available. FINDINGS: There is no evidence of fracture or dislocation. 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 Soft tissues are unremarkable.  IMPRESSION: No acute fracture or dislocation. Electronically Signed   By: Placido Sou M.D.   On: 10/19/2022 23:24    Procedures Procedures    Medications Ordered in ED Medications - No data to display  ED Course/ Medical Decision Making/ A&P                           Medical Decision Making  X-ray of left knee per my review interpretation negative.  Patient able to ambulate without difficulty.  Will discharge home and he will follow with his doctor as needed        Final Clinical Impression(s) / ED Diagnoses Final diagnoses:  None    Rx / DC Orders ED Discharge Orders     None         Lacretia Leigh, MD 10/20/22 1940

## 2022-10-20 NOTE — ED Provider Notes (Signed)
[facility]EMERGENCY DEPT Provider Note   CSN: [id number] Arrival date & time: 10/19/22  2118     History  Chief Complaint  Patient presents with   Shoulder Pain    Charles Patterson is a 56 y.o. male.  HPI     This is a 56 year old male who presents with left shoulder pain.  Patient reports history of chronic left rotator cuff injury.  He states he was playing pool tonight when he felt something pop in his shoulder.  Since that time he has had pain in his neck radiating into his arm.  He reports some tingling in his small finger.  He states that he had a rotator cuff injury 3 years ago and opted for physical therapy.  He has been doing well since that time.  Denies weakness in the hand.  Has not taken anything for his pain.  Home Medications Prior to Admission medications   Medication Sig Start Date End Date Taking? Authorizing Provider  methylPREDNISolone (MEDROL DOSEPAK) 4 MG TBPK tablet Take as directed on packet 10/20/22  Yes Jerzy Roepke, Mayer Masker, MD  lidocaine (HM LIDOCAINE PATCH) 4 % Place 1 patch onto the skin every 12 (twelve) hours as needed. Patient not taking: Reported on 07/14/2022 02/25/22   Haskel Schroeder, PA-C      Allergies    Patient has no known allergies.    Review of Systems   Review of Systems  Musculoskeletal:  Positive for neck pain.       Left shoulder pain  Neurological:  Positive for numbness.  All other systems reviewed and are negative.   Physical Exam Updated Vital Signs BP (!) 162/94 (BP Location: Right Arm)   Pulse (!) 101   Temp 98.5 F (36.9 C) (Oral)   Resp 16   Ht 1.702 m (5\' 7" )   Wt 83 kg   SpO2 99%   BMI 28.66 kg/m  Physical Exam Vitals and nursing note reviewed.  Constitutional:      Appearance: He is well-developed. He is not ill-appearing.  HENT:     Head: Normocephalic and atraumatic.  Eyes:     Pupils: Pupils are equal, round, and reactive to light.  Cardiovascular:     Rate and Rhythm:  Normal rate and regular rhythm.  Pulmonary:     Effort: Pulmonary effort is normal. No respiratory distress.  Abdominal:     Palpations: Abdomen is soft.     Tenderness: There is no abdominal tenderness.  Musculoskeletal:     Cervical back: Neck supple.     Comments: Normal range of motion of left shoulder, pain with abduction, tenderness to palpation over the left paraspinous muscle region of the cervical spine  Lymphadenopathy:     Cervical: No cervical adenopathy.  Skin:    General: Skin is warm and dry.  Neurological:     Mental Status: He is alert and oriented to person, place, and time.     Comments: 5 out of 5 strength grip, biceps, triceps, deltoid strength  Psychiatric:        Mood and Affect: Mood normal.     ED Results / Procedures / Treatments   Labs (all labs ordered are listed, but only abnormal results are displayed) Labs Reviewed - No data to display  EKG None  Radiology DG Shoulder Left  Result Date: 10/19/2022 CLINICAL DATA:  Left shoulder pain since earlier this evening EXAM: LEFT SHOULDER - 2+ VIEW COMPARISON:  None Available. FINDINGS: There is no  evidence of fracture or dislocation. 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 Soft tissues are unremarkable. IMPRESSION: No acute fracture or dislocation. Electronically Signed   By: Minerva Fester M.D.   On: 10/19/2022 23:24    Procedures Procedures    Medications Ordered in ED Medications  ketorolac (TORADOL) 15 MG/ML injection 15 mg (has no administration in time range)    ED Course/ Medical Decision Making/ A&P                           Medical Decision Making Risk Prescription drug management.   This patient presents to the ED for concern of shoulder and neck pain, this involves an extensive number of treatment options, and is a complaint that carries with it a high risk of complications and morbidity.  I considered the following differential and admission for this acute,  potentially life threatening condition.  The differential diagnosis includes recurrent rotator cuff injury, radicular pain, arthritis, dislocation, less likely fracture  MDM:    This is a 56 year old male who presents with recurrent left shoulder pain.  He is nontoxic.  And vital signs are reassuring.  He has normal range of motion of the shoulder.  He reports some numbness in the ulnar distal attribution.  Also reports radiation of pain from the neck.  Unclear whether this is true shoulder origin or may be a radicular cervical problem.  His strength is intact.  Regardless, would recommend anti-inflammatories.  Will send him with a Medrol Dosepak.  He was given Toradol in the emergency department.  X-ray showed no signs of fracture or dislocation.  (Labs, imaging, consults)  Labs: I Ordered, and personally interpreted labs.  The pertinent results include: None  Imaging Studies ordered: I ordered imaging studies including left shoulder x-rays I independently visualized and interpreted imaging. I agree with the radiologist interpretation  Additional history obtained from chart review.  External records from outside source obtained and reviewed including prior evaluations  Cardiac Monitoring: The patient was maintained on a cardiac monitor.  I personally viewed and interpreted the cardiac monitored which showed an underlying rhythm of: SInus rhythm  Reevaluation: After the interventions noted above, I reevaluated the patient and found that they have :improved  Social Determinants of Health:  Lives independently  Disposition: Discharge  Co morbidities that complicate the patient evaluation  Past Medical History:  Diagnosis Date   Wrist fracture 1995   left     Medicines Meds ordered this encounter  Medications   ketorolac (TORADOL) 15 MG/ML injection 15 mg   methylPREDNISolone (MEDROL DOSEPAK) 4 MG TBPK tablet    Sig: Take as directed on packet    Dispense:  21 tablet     Refill:  0    I have reviewed the patients home medicines and have made adjustments as needed  Problem List / ED Course: Problem List Items Addressed This Visit   None Visit Diagnoses     Acute pain of right shoulder    -  Primary   Radicular pain in left arm                       Final Clinical Impression(s) / ED Diagnoses Final diagnoses:  Acute pain of right shoulder  Radicular pain in left arm    Rx / DC Orders ED Discharge Orders          Ordered    methylPREDNISolone (MEDROL DOSEPAK) 4 MG TBPK  tablet        10/20/22 0123              Shon Baton, MD 10/20/22 [phone]

## 2022-10-20 NOTE — ED Notes (Signed)
RN approached patient to assess knee pain, patient requested to return to waiting room to finish watching the game while he waited for XRAY results. MD Aware.

## 2022-10-26 ENCOUNTER — Encounter (HOSPITAL_COMMUNITY): Payer: Self-pay | Admitting: Emergency Medicine

## 2022-10-26 ENCOUNTER — Emergency Department (HOSPITAL_COMMUNITY)
Admission: EM | Admit: 2022-10-26 | Discharge: 2022-10-27 | Discharge status: Against Medical Advice | Payer: No Typology Code available for payment source | Attending: Emergency Medicine | Admitting: Emergency Medicine

## 2022-10-26 DIAGNOSIS — R1013 Epigastric pain: Secondary | ICD-10-CM | POA: Insufficient documentation

## 2022-10-26 DIAGNOSIS — Z5321 Procedure and treatment not carried out due to patient leaving prior to being seen by health care provider: Secondary | ICD-10-CM | POA: Diagnosis not present

## 2022-10-26 HISTORY — DX: Essential (primary) hypertension: I10

## 2022-10-26 LAB — CBC WITH DIFFERENTIAL/PLATELET
Abs Immature Granulocytes: 0.03 10*3/uL (ref 0.00–0.07)
Basophils Absolute: 0 10*3/uL (ref 0.0–0.1)
Basophils Relative: 0 %
Eosinophils Absolute: 0.2 10*3/uL (ref 0.0–0.5)
Eosinophils Relative: 4 %
HCT: 36.5 % — ABNORMAL LOW (ref 39.0–52.0)
Hemoglobin: 12.5 g/dL — ABNORMAL LOW (ref 13.0–17.0)
Immature Granulocytes: 1 %
Lymphocytes Relative: 46 %
Lymphs Abs: 2.4 10*3/uL (ref 0.7–4.0)
MCH: 33.1 pg (ref 26.0–34.0)
MCHC: 34.2 g/dL (ref 30.0–36.0)
MCV: 96.6 fL (ref 80.0–100.0)
Monocytes Absolute: 0.7 10*3/uL (ref 0.1–1.0)
Monocytes Relative: 14 %
Neutro Abs: 1.8 10*3/uL (ref 1.7–7.7)
Neutrophils Relative %: 35 %
Platelets: 253 10*3/uL (ref 150–400)
RBC: 3.78 MIL/uL — ABNORMAL LOW (ref 4.22–5.81)
RDW: 12.8 % (ref 11.5–15.5)
WBC: 5.2 10*3/uL (ref 4.0–10.5)
nRBC: 0 % (ref 0.0–0.2)

## 2022-10-26 LAB — COMPREHENSIVE METABOLIC PANEL
ALT: 18 U/L (ref 0–44)
AST: 25 U/L (ref 15–41)
Albumin: 3.5 g/dL (ref 3.5–5.0)
Alkaline Phosphatase: 45 U/L (ref 38–126)
Anion gap: 7 (ref 5–15)
BUN: 8 mg/dL (ref 6–20)
CO2: 25 mmol/L (ref 22–32)
Calcium: 8.9 mg/dL (ref 8.9–10.3)
Chloride: 105 mmol/L (ref 98–111)
Creatinine, Ser: 1.22 mg/dL (ref 0.61–1.24)
GFR, Estimated: >60 mL/min (ref >60)
Glucose, Bld: 109 mg/dL — ABNORMAL HIGH (ref 70–99)
Potassium: 3.4 mmol/L — ABNORMAL LOW (ref 3.5–5.1)
Sodium: 137 mmol/L (ref 135–145)
Total Bilirubin: 0.3 mg/dL (ref 0.3–1.2)
Total Protein: 6.1 g/dL — ABNORMAL LOW (ref 6.5–8.1)

## 2022-10-26 LAB — LIPASE, BLOOD: Lipase: 54 U/L — ABNORMAL HIGH (ref 11–51)

## 2022-10-26 NOTE — ED Provider Triage Note (Signed)
Emergency Medicine Provider Triage Evaluation Note  NICKLOUS ABURTO , a 56 y.o. male  was evaluated in triage.  Pt complains of epigastric abdominal pain since this morning. Describes it as a burning like pain similar to acid reflux. Took some Pepto Bismol with temporary relief. Denies fever, chills, nausea, vomiting, diarrhea, urinary symptoms. States he drinks a lot of coffee and is afraid he has an ulcer. No history of PUD. No alcohol or frequent NSAID use.   Review of Systems  Positive: See HPI Negative: See HPI  Physical Exam  BP (!) 151/78 (BP Location: Right Arm)   Pulse 86   Temp 98.9 F (37.2 C) (Oral)   Resp 17   SpO2 97%  Gen:   Awake, no distress   Resp:  Normal effort LCTA MSK:   Moves extremities without difficulty  Other:  Abd nontender, no rebound or guarding  Medical Decision Making  Medically screening exam initiated at 8:36 PM.  Appropriate orders placed.  Alfonso Ramus was informed that the remainder of the evaluation will be completed by another provider, this initial triage assessment does not replace that evaluation, and the importance of remaining in the ED until their evaluation is complete.     Richardson Dopp 10/26/22 2139

## 2022-10-26 NOTE — ED Triage Notes (Addendum)
Stomach pain starting today took pepto bismal and helped some. Denies N,V,D, SOB or chest pain. Last BM was today. Pain in right above umbilicus and does not radiate.

## 2022-11-05 ENCOUNTER — Emergency Department (HOSPITAL_COMMUNITY)
Admission: EM | Admit: 2022-11-05 | Discharge: 2022-11-06 | Discharge status: Against Medical Advice | Payer: No Typology Code available for payment source | Attending: Emergency Medicine | Admitting: Emergency Medicine

## 2022-11-05 ENCOUNTER — Other Ambulatory Visit: Payer: Self-pay

## 2022-11-05 DIAGNOSIS — Z1152 Encounter for screening for COVID-19: Secondary | ICD-10-CM | POA: Diagnosis not present

## 2022-11-05 DIAGNOSIS — R9081 Abnormal echoencephalogram: Secondary | ICD-10-CM | POA: Insufficient documentation

## 2022-11-05 DIAGNOSIS — I10 Essential (primary) hypertension: Secondary | ICD-10-CM | POA: Insufficient documentation

## 2022-11-05 DIAGNOSIS — Z5321 Procedure and treatment not carried out due to patient leaving prior to being seen by health care provider: Secondary | ICD-10-CM | POA: Insufficient documentation

## 2022-11-05 DIAGNOSIS — R051 Acute cough: Secondary | ICD-10-CM | POA: Insufficient documentation

## 2022-11-05 DIAGNOSIS — R059 Cough, unspecified: Secondary | ICD-10-CM | POA: Diagnosis present

## 2022-11-05 DIAGNOSIS — M791 Myalgia, unspecified site: Secondary | ICD-10-CM | POA: Diagnosis not present

## 2022-11-06 ENCOUNTER — Other Ambulatory Visit: Payer: Self-pay

## 2022-11-06 ENCOUNTER — Emergency Department (HOSPITAL_COMMUNITY)
Admission: EM | Admit: 2022-11-06 | Discharge: 2022-11-07 | Disposition: A | Discharge status: Home / Self Care | Payer: No Typology Code available for payment source | Attending: Student | Admitting: Student

## 2022-11-06 ENCOUNTER — Emergency Department (HOSPITAL_COMMUNITY): Payer: No Typology Code available for payment source

## 2022-11-06 ENCOUNTER — Encounter (HOSPITAL_COMMUNITY): Payer: Self-pay

## 2022-11-06 DIAGNOSIS — R0981 Nasal congestion: Secondary | ICD-10-CM | POA: Insufficient documentation

## 2022-11-06 DIAGNOSIS — I10 Essential (primary) hypertension: Secondary | ICD-10-CM | POA: Insufficient documentation

## 2022-11-06 DIAGNOSIS — R051 Acute cough: Secondary | ICD-10-CM

## 2022-11-06 DIAGNOSIS — M791 Myalgia, unspecified site: Secondary | ICD-10-CM | POA: Insufficient documentation

## 2022-11-06 LAB — RESP PANEL BY RT-PCR (RSV, FLU A&B, COVID)  RVPGX2
Influenza A by PCR: NEGATIVE
Influenza B by PCR: NEGATIVE
Resp Syncytial Virus by PCR: NEGATIVE
SARS Coronavirus 2 by RT PCR: NEGATIVE

## 2022-11-06 NOTE — ED Provider Triage Note (Signed)
  Emergency Medicine Provider Triage Evaluation Note  MRN:  [id number]  Arrival date & time: 11/06/22    Medically screening exam initiated at 12:13 AM.   CC:   Cough   HPI:  KALEEL SCHMIEDER is a 57 y.o. year-old male presents to the ED with chief complaint of cough for the past few days.  States that it is productive.  Afebrile in triage.  History provided by patient. ROS:  -As included in HPI PE:   Vitals:   11/06/22 0008  BP: (!) 136/98  Pulse: (!) 101  Resp: 18  Temp: 97.8 F (36.6 C)  SpO2: 99%    Non-toxic appearing No respiratory distress  MDM:  Based on signs and symptoms, URI is highest on my differential, followed by CAP. I've ordered covid and CXR in triage to expedite lab/diagnostic workup.  Patient was informed that the remainder of the evaluation will be completed by another provider, this initial triage assessment does not replace that evaluation, and the importance of remaining in the ED until their evaluation is complete.    Montine Circle, PA-C 11/06/22 0013

## 2022-11-06 NOTE — ED Triage Notes (Signed)
Pt arrives with c/o cough and phlegm. Per pt, he has not been feeling well.

## 2022-11-06 NOTE — ED Triage Notes (Signed)
Pt via POV c/o cough. Was seen yesterday but left d/t long wait.

## 2022-11-06 NOTE — ED Notes (Signed)
Pt left ED.

## 2022-11-07 MED ORDER — BENZONATATE 100 MG PO CAPS: 100.0000 mg | ORAL_CAPSULE | Freq: Three times a day (TID) | ORAL | 0 refills | Status: DC | PRN

## 2022-11-07 NOTE — Discharge Instructions (Addendum)
You were seen in the ER today for your cough.  Your physical exam reassuring.  Your chest x-ray and viral testing yesterday was normal.  Make the prescribed cough medication as needed and follow-up primary care doctor.

## 2022-11-07 NOTE — ED Provider Notes (Signed)
[facility] EMERGENCY DEPARTMENT Provider Note   CSN: [id number] Arrival date & time: 11/06/22  2316     History  Chief Complaint  Patient presents with   Cough    Charles Patterson is a 57 y.o. male who presents with concern for cough x 3 days.  Associated body aches and nasal congestion and phlegm.  I personally reviewed his medical records.  History of hypertension.  Was seen in the ER yesterday and had chest x-ray and RVP but left due to wait times.  HPI     Home Medications Prior to Admission medications   Medication Sig Start Date End Date Taking? Authorizing Provider  benzonatate (TESSALON) 100 MG capsule Take 1 capsule (100 mg total) by mouth 3 (three) times daily as needed for cough. 11/07/22  Yes Saori Umholtz R, PA-C  lidocaine (HM LIDOCAINE PATCH) 4 % Place 1 patch onto the skin every 12 (twelve) hours as needed. Patient not taking: Reported on 07/14/2022 02/25/22   Loni Beckwith, PA-C  methylPREDNISolone (MEDROL DOSEPAK) 4 MG TBPK tablet Take as directed on packet 10/20/22   Horton, Barbette Hair, MD      Allergies    Patient has no known allergies.    Review of Systems   Review of Systems  Constitutional:  Positive for activity change, appetite change, chills, fatigue and fever.  HENT:  Positive for congestion, rhinorrhea and sinus pressure.   Respiratory:  Positive for cough.   Cardiovascular: Negative.   Gastrointestinal: Negative.   Genitourinary: Negative.   Neurological: Negative.     Physical Exam Updated Vital Signs BP (!) 180/115   Pulse 84   Temp 98.7 F (37.1 C) (Oral)   Resp 15   Ht 5\' 7"  (1.702 m)   Wt 82.6 kg   SpO2 99%   BMI 28.51 kg/m  Physical Exam Vitals and nursing note reviewed.  Constitutional:      Appearance: He is not ill-appearing or toxic-appearing.  HENT:     Head: Normocephalic and atraumatic.     Nose: Congestion present.     Mouth/Throat:     Mouth: Mucous membranes are moist.      Pharynx: No oropharyngeal exudate or posterior oropharyngeal erythema.  Eyes:     General:        Right eye: No discharge.        Left eye: No discharge.     Extraocular Movements: Extraocular movements intact.     Conjunctiva/sclera: Conjunctivae normal.     Pupils: Pupils are equal, round, and reactive to light.  Cardiovascular:     Rate and Rhythm: Normal rate and regular rhythm.     Pulses: Normal pulses.     Heart sounds: Normal heart sounds. No murmur heard. Pulmonary:     Effort: Pulmonary effort is normal. No respiratory distress.     Breath sounds: Normal breath sounds. No wheezing or rales.  Abdominal:     General: Bowel sounds are normal. There is no distension.     Palpations: Abdomen is soft.     Tenderness: There is no abdominal tenderness. There is no guarding or rebound.  Musculoskeletal:        General: No deformity.     Cervical back: Neck supple.     Right lower leg: No edema.     Left lower leg: No edema.  Skin:    General: Skin is warm and dry.     Capillary Refill: Capillary refill takes less than 2  seconds.  Neurological:     General: No focal deficit present.     Mental Status: He is alert and oriented to person, place, and time. Mental status is at baseline.  Psychiatric:        Mood and Affect: Mood normal.     ED Results / Procedures / Treatments   Labs (all labs ordered are listed, but only abnormal results are displayed) Labs Reviewed - No data to display  EKG None  Radiology DG Chest 2 View  Result Date: 11/06/2022 CLINICAL DATA:  Cough. EXAM: CHEST - 2 VIEW COMPARISON:  08/07/2022. FINDINGS: The heart size and mediastinal contours are within normal limits. No consolidation, effusion, or pneumothorax. No acute osseous abnormality. IMPRESSION: No active cardiopulmonary disease. Electronically Signed   By: Brett Fairy M.D.   On: 11/06/2022 00:54    Procedures Procedures    Medications Ordered in ED Medications - No data to  display  ED Course/ Medical Decision Making/ A&P                           Medical Decision Making 57 year old male presents with cough.  Hypertensive on intake, vitals otherwise normal.  Cardiopulmonary dam is normal, abdominal exam is benign.  Patient well-appearing.  Nasal congestion.  Amount and/or Complexity of Data Reviewed Labs:     Details: RVP negative. Radiology:     Details: Chest x-ray from yesterday reviewed by this provider, negative for acute cardiopulmonary disease, visualized with provider agree with radiology interpretation.  Risk Prescription drug management.   Clinical picture most consistent with acute viral URI.  Patient appears clinically well-hydrated, overall well-appearing.  Clinical concern for emergent underlying etiology would warrant further ED workup or inpatient management is exceedingly low.  Charles Patterson voiced understanding of his medical evaluation and treatment plan. Each of their questions answered to their expressed satisfaction.  Return precautions were given.  Patient is well-appearing, stable, and was discharged in good condition.  This chart was dictated using voice recognition software, Dragon. Despite the best efforts of this provider to proofread and correct errors, errors may still occur which can change documentation meaning.  Final Clinical Impression(s) / ED Diagnoses Final diagnoses:  Acute cough    Rx / DC Orders ED Discharge Orders          Ordered    benzonatate (TESSALON) 100 MG capsule  3 times daily PRN        11/07/22 0005              Marticia Reifschneider, Gypsy Balsam, PA-C 11/07/22 0042    Quintella Reichert, MD 11/07/22 [phone]

## 2022-11-10 ENCOUNTER — Other Ambulatory Visit: Payer: Self-pay

## 2022-11-10 ENCOUNTER — Emergency Department (HOSPITAL_COMMUNITY)
Admission: EM | Admit: 2022-11-10 | Discharge: 2022-11-10 | Disposition: A | Discharge status: Home / Self Care | Payer: No Typology Code available for payment source | Attending: Emergency Medicine | Admitting: Emergency Medicine

## 2022-11-10 DIAGNOSIS — I10 Essential (primary) hypertension: Secondary | ICD-10-CM | POA: Insufficient documentation

## 2022-11-10 DIAGNOSIS — R109 Unspecified abdominal pain: Secondary | ICD-10-CM | POA: Diagnosis present

## 2022-11-10 DIAGNOSIS — R1013 Epigastric pain: Secondary | ICD-10-CM | POA: Insufficient documentation

## 2022-11-10 DIAGNOSIS — R748 Abnormal levels of other serum enzymes: Secondary | ICD-10-CM | POA: Insufficient documentation

## 2022-11-10 LAB — COMPREHENSIVE METABOLIC PANEL
ALT: 16 U/L (ref 0–44)
AST: 25 U/L (ref 15–41)
Albumin: 3.7 g/dL (ref 3.5–5.0)
Alkaline Phosphatase: 45 U/L (ref 38–126)
Anion gap: 12 (ref 5–15)
BUN: 10 mg/dL (ref 6–20)
CO2: 23 mmol/L (ref 22–32)
Calcium: 9.6 mg/dL (ref 8.9–10.3)
Chloride: 102 mmol/L (ref 98–111)
Creatinine, Ser: 1.18 mg/dL (ref 0.61–1.24)
GFR, Estimated: >60 mL/min (ref >60)
Glucose, Bld: 114 mg/dL — ABNORMAL HIGH (ref 70–99)
Potassium: 3.7 mmol/L (ref 3.5–5.1)
Sodium: 137 mmol/L (ref 135–145)
Total Bilirubin: 0.5 mg/dL (ref 0.3–1.2)
Total Protein: 6.4 g/dL — ABNORMAL LOW (ref 6.5–8.1)

## 2022-11-10 LAB — URINALYSIS, ROUTINE W REFLEX MICROSCOPIC
Bilirubin Urine: NEGATIVE
Glucose, UA: NEGATIVE mg/dL
Hgb urine dipstick: NEGATIVE
Ketones, ur: NEGATIVE mg/dL
Leukocytes,Ua: NEGATIVE
Nitrite: NEGATIVE
Protein, ur: NEGATIVE mg/dL
Specific Gravity, Urine: 1.004 — ABNORMAL LOW (ref 1.005–1.030)
pH: 6 (ref 5.0–8.0)

## 2022-11-10 LAB — CBC
HCT: 41.3 % (ref 39.0–52.0)
Hemoglobin: 14.1 g/dL (ref 13.0–17.0)
MCH: 32.8 pg (ref 26.0–34.0)
MCHC: 34.1 g/dL (ref 30.0–36.0)
MCV: 96 fL (ref 80.0–100.0)
Platelets: 256 10*3/uL (ref 150–400)
RBC: 4.3 MIL/uL (ref 4.22–5.81)
RDW: 12.9 % (ref 11.5–15.5)
WBC: 5 10*3/uL (ref 4.0–10.5)
nRBC: 0 % (ref 0.0–0.2)

## 2022-11-10 LAB — LIPASE, BLOOD: Lipase: 80 U/L — ABNORMAL HIGH (ref 11–51)

## 2022-11-10 NOTE — ED Provider Triage Note (Signed)
Emergency Medicine Provider Triage Evaluation Note  KEIVON GARDEN , a 57 y.o. male  was evaluated in triage.  Pt complains of abd pain that lasted for 30 min ealier today. No NVD no other sx.  Review of Systems  Positive: Abd pain Negative: Fever   Physical Exam  There were no vitals taken for this visit. Gen:   Awake, no distress   Resp:  Normal effort  MSK:   Moves extremities without difficulty  Other:  Abd NTTP  Medical Decision Making  Medically screening exam initiated at 3:11 AM.  Appropriate orders placed.  Rudean Hitt was informed that the remainder of the evaluation will be completed by another provider, this initial triage assessment does not replace that evaluation, and the importance of remaining in the ED until their evaluation is complete.  [address]    Pati Gallo [place], [place] 11/10/22 [phone]

## 2022-11-10 NOTE — ED Provider Notes (Signed)
[facility] EMERGENCY DEPARTMENT Provider Note   CSN: [id number] Arrival date & time: 11/10/22  0127     History  Chief Complaint  Patient presents with   Abdominal Pain    Charles Patterson is a 57 y.o. male with history of hypertension who presents the emergency department complaining of abdominal pain lasting for about 30 minutes after eating hibachi shrimp.  No nausea, vomiting, diarrhea.  States pain is totally resolved at this time.  No history of issues with his pancreas.   Abdominal Pain      Home Medications Prior to Admission medications   Medication Sig Start Date End Date Taking? Authorizing Provider  benzonatate (TESSALON) 100 MG capsule Take 1 capsule (100 mg total) by mouth 3 (three) times daily as needed for cough. 11/07/22   Sponseller, Rebekah R, PA-C  lidocaine (HM LIDOCAINE PATCH) 4 % Place 1 patch onto the skin every 12 (twelve) hours as needed. Patient not taking: Reported on 07/14/2022 02/25/22   Loni Beckwith, PA-C  methylPREDNISolone (MEDROL DOSEPAK) 4 MG TBPK tablet Take as directed on packet 10/20/22   Horton, Barbette Hair, MD      Allergies    Patient has no known allergies.    Review of Systems   Review of Systems  Gastrointestinal:  Positive for abdominal pain.  All other systems reviewed and are negative.   Physical Exam Updated Vital Signs BP 118/78 (BP Location: Right Arm)   Pulse 70   Temp 98.1 F (36.7 C)   Resp 18   SpO2 96%  Physical Exam Vitals and nursing note reviewed.  Constitutional:      Appearance: Normal appearance.  HENT:     Head: Normocephalic and atraumatic.  Eyes:     Conjunctiva/sclera: Conjunctivae normal.  Cardiovascular:     Rate and Rhythm: Normal rate and regular rhythm.  Pulmonary:     Effort: Pulmonary effort is normal. No respiratory distress.     Breath sounds: Normal breath sounds.  Abdominal:     General: There is no distension.     Palpations: Abdomen is soft.      Tenderness: There is no abdominal tenderness.  Skin:    General: Skin is warm and dry.  Neurological:     General: No focal deficit present.     Mental Status: He is alert.     ED Results / Procedures / Treatments   Labs (all labs ordered are listed, but only abnormal results are displayed) Labs Reviewed  LIPASE, BLOOD - Abnormal; Notable for the following components:      Result Value   Lipase 80 (*)    All other components within normal limits  COMPREHENSIVE METABOLIC PANEL - Abnormal; Notable for the following components:   Glucose, Bld 114 (*)    Total Protein 6.4 (*)    All other components within normal limits  URINALYSIS, ROUTINE W REFLEX MICROSCOPIC - Abnormal; Notable for the following components:   Color, Urine STRAW (*)    Specific Gravity, Urine 1.004 (*)    All other components within normal limits  CBC    EKG None  Radiology No results found.  Procedures Procedures    Medications Ordered in ED Medications - No data to display  ED Course/ Medical Decision Making/ A&P                           Medical Decision Making  This patient is a 57  y.o. male  who presents to the ED for concern of abdominal pain.   Differential diagnoses prior to evaluation: The emergent differential diagnosis includes, but is not limited to,  AAA, mesenteric ischemia, appendicitis, diverticulitis, DKA, gastroenteritis, nephrolithiasis, pancreatitis, constipation, UTI, bowel obstruction, biliary disease, IBD, PUD, hepatitis. This is not an exhaustive differential.   Past Medical History / Co-morbidities: HTN  Additional history: Chart reviewed. Pertinent results include: [facility] Seen previously on 12/23 for similar pain, had elevated lipase to 54 at that time.  Without being seen due to wait times.  Physical Exam: Physical exam performed. The pertinent findings include: On exam patient is clinically well-appearing.  Normal vital signs.  Abdomen  soft, with no tenderness.  Not complaining of any pain at this time.  Lab Tests/Imaging studies: I personally interpreted labs/imaging and the pertinent results include: BC and CMP grossly unremarkable.  Lipase 80.  Urinalysis negative for hematuria or infection.  Disposition: After consideration of the diagnostic results and the patients response to treatment, I feel that emergency department workup does not suggest an emergent condition requiring admission or immediate intervention beyond what has been performed at this time. The plan is: Discharged home with recommendation to follow-up with PCP about elevated lipase level.  Commended and offered the patient CT imaging to evaluate his biliary tract, he states he has other things to do today and would like to go home and follow-up with his doctor.  He has an appointment with his doctor next week.  Encouraged him to limit alcohol intake and fatty foods.  The patient is safe for discharge and has been instructed to return immediately for worsening symptoms, change in symptoms or any other concerns.  Final Clinical Impression(s) / ED Diagnoses Final diagnoses:  Epigastric pain  Elevated lipase    Rx / DC Orders ED Discharge Orders     None      Portions of this report may have been transcribed using voice recognition software. Every effort was made to ensure accuracy; however, inadvertent computerized transcription errors may be present.    Jeanella Flattery 11/10/22 0912    Wynetta Fines, MD 11/10/22 [phone]

## 2022-11-10 NOTE — Discharge Instructions (Signed)
You were seen in the emergency department today for abdominal pain.  As we discussed your lab work was mostly unremarkable.  Your lipase level, which helps [place] evaluate the functioning of your pancreas, was elevated.  We discussed CT imaging, but you would like to follow-up with your doctor.  I have attached some recommendations for an eating plan that can help with your symptoms.  It is important that you limit your alcohol usage and fatty food intake.  Continue to monitor how you're doing and return to the ER for new or worsening symptoms.

## 2022-11-10 NOTE — ED Triage Notes (Signed)
Patient reports intermittent mid abdominal pain onset yesterday afternoon , no emesis or diarrhea .

## 2022-11-17 ENCOUNTER — Other Ambulatory Visit: Payer: Self-pay

## 2022-11-17 ENCOUNTER — Emergency Department (HOSPITAL_COMMUNITY)
Admission: EM | Admit: 2022-11-17 | Discharge: 2022-11-18 | Disposition: A | Discharge status: Home / Self Care | Payer: No Typology Code available for payment source | Attending: Emergency Medicine | Admitting: Emergency Medicine

## 2022-11-17 DIAGNOSIS — R0789 Other chest pain: Secondary | ICD-10-CM | POA: Diagnosis present

## 2022-11-17 DIAGNOSIS — I10 Essential (primary) hypertension: Secondary | ICD-10-CM | POA: Insufficient documentation

## 2022-11-17 DIAGNOSIS — Z79899 Other long term (current) drug therapy: Secondary | ICD-10-CM | POA: Insufficient documentation

## 2022-11-17 LAB — CBC WITH DIFFERENTIAL/PLATELET
Abs Immature Granulocytes: 0.05 10*3/uL (ref 0.00–0.07)
Basophils Absolute: 0 10*3/uL (ref 0.0–0.1)
Basophils Relative: 0 %
Eosinophils Absolute: 0.2 10*3/uL (ref 0.0–0.5)
Eosinophils Relative: 4 %
HCT: 39.1 % (ref 39.0–52.0)
Hemoglobin: 13.3 g/dL (ref 13.0–17.0)
Immature Granulocytes: 1 %
Lymphocytes Relative: 43 %
Lymphs Abs: 2.4 10*3/uL (ref 0.7–4.0)
MCH: 33.6 pg (ref 26.0–34.0)
MCHC: 34 g/dL (ref 30.0–36.0)
MCV: 98.7 fL (ref 80.0–100.0)
Monocytes Absolute: 0.5 10*3/uL (ref 0.1–1.0)
Monocytes Relative: 10 %
Neutro Abs: 2.4 10*3/uL (ref 1.7–7.7)
Neutrophils Relative %: 42 %
Platelets: 265 10*3/uL (ref 150–400)
RBC: 3.96 MIL/uL — ABNORMAL LOW (ref 4.22–5.81)
RDW: 13.6 % (ref 11.5–15.5)
WBC: 5.6 10*3/uL (ref 4.0–10.5)
nRBC: 0 % (ref 0.0–0.2)

## 2022-11-17 LAB — BASIC METABOLIC PANEL
Anion gap: 9 (ref 5–15)
BUN: 10 mg/dL (ref 6–20)
CO2: 25 mmol/L (ref 22–32)
Calcium: 8.6 mg/dL — ABNORMAL LOW (ref 8.9–10.3)
Chloride: 103 mmol/L (ref 98–111)
Creatinine, Ser: 1.18 mg/dL (ref 0.61–1.24)
GFR, Estimated: >60 mL/min (ref >60)
Glucose, Bld: 125 mg/dL — ABNORMAL HIGH (ref 70–99)
Potassium: 3.7 mmol/L (ref 3.5–5.1)
Sodium: 137 mmol/L (ref 135–145)

## 2022-11-17 NOTE — ED Triage Notes (Signed)
Pt arrives with c/o left side muscle pain. Per pt, the pain is under his rib cage. Pt denies CP, SOB, or n/v.

## 2022-11-17 NOTE — ED Notes (Addendum)
NA for triage by triage RN x3.

## 2022-11-18 ENCOUNTER — Emergency Department (HOSPITAL_COMMUNITY): Payer: No Typology Code available for payment source

## 2022-11-18 ENCOUNTER — Encounter (HOSPITAL_COMMUNITY): Payer: Self-pay

## 2022-11-18 DIAGNOSIS — R0789 Other chest pain: Secondary | ICD-10-CM | POA: Diagnosis not present

## 2022-11-18 MED ORDER — IBUPROFEN 600 MG PO TABS: 600.0000 mg | ORAL_TABLET | Freq: Four times a day (QID) | ORAL | 0 refills | Status: DC | PRN

## 2022-11-18 NOTE — ED Provider Notes (Signed)
[facility] EMERGENCY DEPARTMENT Provider Note   CSN: [id number] Arrival date & time: 11/17/22  1943     History  Chief Complaint  Patient presents with   Muscle Pain    Charles Patterson is a 57 y.o. male.  The history is provided by the patient and medical records. No language interpreter was used.  Muscle Pain     57 year-old male with history of HTN that presents with left sided chest pain that began when he was doing trunk-twist exercises yesterday afternoon. He reports a sharp pain in his left chest under his ribcage when performing the exercise, but denies any numbness, tingling, radiation of pain or chest pain. Pain rated 2/10 at time of evaluation, and denies taking an medication to help with pain prior to coming to ED.   Home Medications Prior to Admission medications   Medication Sig Start Date End Date Taking? Authorizing Provider  benzonatate (TESSALON) 100 MG capsule Take 1 capsule (100 mg total) by mouth 3 (three) times daily as needed for cough. 11/07/22   Sponseller, Rebekah R, PA-C  lidocaine (HM LIDOCAINE PATCH) 4 % Place 1 patch onto the skin every 12 (twelve) hours as needed. Patient not taking: Reported on 07/14/2022 02/25/22   Loni Beckwith, PA-C  methylPREDNISolone (MEDROL DOSEPAK) 4 MG TBPK tablet Take as directed on packet 10/20/22   Horton, Barbette Hair, MD      Allergies    Patient has no known allergies.    Review of Systems   Review of Systems  All other systems reviewed and are negative.   Physical Exam Updated Vital Signs BP (!) 153/69 (BP Location: Left Arm)   Pulse 74   Temp 98.4 F (36.9 C)   Resp 18   Ht 5\' 7"  (1.702 m)   Wt 82.6 kg   SpO2 96%   BMI 28.51 kg/m  Physical Exam Vitals and nursing note reviewed.  Constitutional:      General: He is not in acute distress.    Appearance: He is well-developed.  HENT:     Head: Atraumatic.  Eyes:     Conjunctiva/sclera: Conjunctivae normal.  Cardiovascular:      Rate and Rhythm: Normal rate and regular rhythm.     Pulses: Normal pulses.     Heart sounds: Normal heart sounds.  Pulmonary:     Effort: Pulmonary effort is normal.     Breath sounds: Normal breath sounds. No wheezing, rhonchi or rales.  Chest:     Chest wall: Tenderness (mild tenderness to L lateral chest wall with twisting motion.  no overlying skin changes, crepitus or emphysema) present.  Abdominal:     Palpations: Abdomen is soft.     Tenderness: There is no abdominal tenderness.  Musculoskeletal:     Cervical back: Neck supple.  Skin:    Capillary Refill: Capillary refill takes less than 2 seconds.     Findings: No rash.  Neurological:     Mental Status: He is alert.     ED Results / Procedures / Treatments   Labs (all labs ordered are listed, but only abnormal results are displayed) Labs Reviewed  CBC WITH DIFFERENTIAL/PLATELET - Abnormal; Notable for the following components:      Result Value   RBC 3.96 (*)    All other components within normal limits  BASIC METABOLIC PANEL - Abnormal; Notable for the following components:   Glucose, Bld 125 (*)    Calcium 8.6 (*)  All other components within normal limits    EKG EKG Interpretation  Date/Time:  Sunday November 17 2022 21:49:26 EST Ventricular Rate:  74 PR Interval:  132 QRS Duration: 84 QT Interval:  376 QTC Calculation: 417 R Axis:   43 Text Interpretation: Normal sinus rhythm Nonspecific T wave abnormality Abnormal ECG Confirmed by Zadie Rhine ([id number]) on 11/18/2022 6:30:35 AM  Radiology DG Chest 2 View  Result Date: 11/18/2022 CLINICAL DATA:  57 year old male with left side chest pain. EXAM: CHEST - 2 VIEW COMPARISON:  Chest radiographs 11/06/2022 and earlier. FINDINGS: Lung volumes and mediastinal contours remain normal. Visualized tracheal air column is within normal limits. Lung markings appear stable. No pneumothorax, pulmonary edema, pleural effusion, or confluent pulmonary opacity. No  acute osseous abnormality identified. Negative visible bowel gas. IMPRESSION: Stable, negative.  No acute cardiopulmonary abnormality. Electronically Signed   By: Odessa Fleming M.D.   On: 11/18/2022 05:59    Procedures Procedures    Medications Ordered in ED Medications - No data to display  ED Course/ Medical Decision Making/ A&P                             Medical Decision Making  BP (!) 153/69 (BP Location: Left Arm)   Pulse 74   Temp 98.4 F (36.9 C)   Resp 18   Ht 5\' 7"  (1.702 m)   Wt 82.6 kg   SpO2 96%   BMI 28.51 kg/m   6:55 AM This is a 57 year old male significant history of hypertension, tobacco use, presenting with complaints of pain on his chest.  Patient report around 4 PM yesterday he was doing trunk twist exercise when he felt a sharp pain to his left lateral chest wall.  Pain is nonradiating, lasting for approximately 5 minutes and improves after taking some Tylenol.  Patient however did come to the ER for further evaluation.  He denies any associated lightheadedness dizziness nausea diaphoresis shortness of breath or cough.  He did not notice any rash.  He noticed increasing pain when he twists his side.  He denies any prior history of PE or DVT.  He denies any significant family history of cardiac disease.  At this time he rates his pain as 2 out of 10.  On exam, this is a well-appearing male laying in bed sleeping but easily arousable and answering questions appropriately.  Heart with normal rate and rhythm, lungs are clear on auscultation, mild tenderness to left lateral chest wall without any overlying skin changes no crepitus or emphysema or rash.  Abdomen is soft nontender.  -Labs ordered, independently viewed and interpreted by me.  Labs remarkable for reassuring electrolytes panel -The patient was maintained on a cardiac monitor.  I personally viewed and interpreted the cardiac monitored which showed an underlying rhythm of: NSR -Imaging independently viewed and  interpreted by me and I agree with radiologist's interpretation.  Result remarkable for CXR without acute changes -This patient presents to the ED for concern of chest pain, this involves an extensive number of treatment options, and is a complaint that carries with it a high risk of complications and morbidity.  The differential diagnosis includes MSK pain, ACS, PE, PNA, PTX, GERD, dissection, rhabdomyolysis -Co morbidities that complicate the patient evaluation includes tobacco use, HTN -Treatment includes none -Reevaluation of the patient after these medicines showed that the patient improved -PCP office notes or outside notes reviewed -Escalation to admission/observation considered: patients feels much  better, is comfortable with discharge, and will follow up with PCP -Prescription medication considered, patient comfortable with flexeril, ibuprofen -Social Determinant of Health considered which includes tobacco use  Suspect MSK pain causing discomfort.  Low suspicion for any major life-threatening event.  Will discharge home with supportive care, return precaution given.  Tobacco cessation discussed.        Final Clinical Impression(s) / ED Diagnoses Final diagnoses:  Chest wall pain    Rx / DC Orders ED Discharge Orders          Ordered    ibuprofen (ADVIL) 600 MG tablet  Every 6 hours PRN        11/18/22 0706              Domenic Moras, PA-C 11/18/22 5300    Ripley Fraise, MD 11/18/22 [phone]

## 2022-11-20 ENCOUNTER — Encounter (HOSPITAL_COMMUNITY): Payer: Self-pay | Admitting: *Deleted

## 2022-11-20 ENCOUNTER — Emergency Department (HOSPITAL_COMMUNITY)
Admission: EM | Admit: 2022-11-20 | Discharge: 2022-11-20 | Disposition: A | Discharge status: Home / Self Care | Payer: No Typology Code available for payment source | Attending: Emergency Medicine | Admitting: Emergency Medicine

## 2022-11-20 DIAGNOSIS — Z79899 Other long term (current) drug therapy: Secondary | ICD-10-CM | POA: Diagnosis not present

## 2022-11-20 DIAGNOSIS — W230XXA Caught, crushed, jammed, or pinched between moving objects, initial encounter: Secondary | ICD-10-CM | POA: Diagnosis not present

## 2022-11-20 DIAGNOSIS — S3992XA Unspecified injury of lower back, initial encounter: Secondary | ICD-10-CM | POA: Diagnosis present

## 2022-11-20 DIAGNOSIS — S39012A Strain of muscle, fascia and tendon of lower back, initial encounter: Secondary | ICD-10-CM | POA: Insufficient documentation

## 2022-11-20 DIAGNOSIS — R52 Pain, unspecified: Secondary | ICD-10-CM | POA: Diagnosis not present

## 2022-11-20 DIAGNOSIS — I10 Essential (primary) hypertension: Secondary | ICD-10-CM | POA: Diagnosis not present

## 2022-11-20 MED ORDER — METHOCARBAMOL 500 MG PO TABS: 500.0000 mg | ORAL_TABLET | Freq: Two times a day (BID) | ORAL | 0 refills | Status: DC

## 2022-11-20 NOTE — ED Triage Notes (Signed)
The pt is c/o lower back pain he slipped on some ice earlier today

## 2022-11-20 NOTE — ED Provider Notes (Signed)
[facility] EMERGENCY DEPARTMENT Provider Note   CSN: [id number] Arrival date & time: 11/20/22  2248     History  Chief Complaint  Patient presents with   Back Pain    Charles Patterson is a 57 y.o. male.  HPI  Patient is a 57 year old male with a past medical history significant for hypertension presenting emergency room today with complaints of some lower back pain that occurred after he slipped on some ice earlier today.  He states that he did not fall to the ground.  He nearly fell but caught himself by moving his feet quickly and adjusting his center of gravity.  He states he had an achy pain in his back after he did this.  No chest pain difficulty breathing.  No lightheadedness or dizziness.  No traumatic injuries.  He denies any lower or upper extremity pain.  No hematuria chest pain abdominal pain nausea vomiting.  No other associate symptoms he is taken no medications prior to arrival.    Home Medications Prior to Admission medications   Medication Sig Start Date End Date Taking? Authorizing Provider  methocarbamol (ROBAXIN) 500 MG tablet Take 1 tablet (500 mg total) by mouth 2 (two) times daily. 11/20/22  Yes Maxen Rowland S, PA  benzonatate (TESSALON) 100 MG capsule Take 1 capsule (100 mg total) by mouth 3 (three) times daily as needed for cough. 11/07/22   Sponseller, Eugene Garnet R, PA-C  ibuprofen (ADVIL) 600 MG tablet Take 1 tablet (600 mg total) by mouth every 6 (six) hours as needed. 11/18/22   Domenic Moras, PA-C  lidocaine (HM LIDOCAINE PATCH) 4 % Place 1 patch onto the skin every 12 (twelve) hours as needed. Patient not taking: Reported on 07/14/2022 02/25/22   Loni Beckwith, PA-C  methylPREDNISolone (MEDROL DOSEPAK) 4 MG TBPK tablet Take as directed on packet 10/20/22   Horton, Barbette Hair, MD      Allergies    Patient has no known allergies.    Review of Systems   Review of Systems  Physical Exam Updated Vital Signs BP 116/74   Pulse 84    Temp 98.3 F (36.8 C) (Oral)   Resp 18   Ht 5\' 7"  (1.702 m)   Wt 82.6 kg   SpO2 95%   BMI 28.52 kg/m  Physical Exam Vitals and nursing note reviewed.  Constitutional:      General: He is not in acute distress.    Appearance: Normal appearance. He is not ill-appearing.  HENT:     Head: Normocephalic and atraumatic.     Nose: Nose normal.  Eyes:     General: No scleral icterus.       Right eye: No discharge.        Left eye: No discharge.     Conjunctiva/sclera: Conjunctivae normal.  Cardiovascular:     Rate and Rhythm: Normal rate and regular rhythm.     Pulses: Normal pulses.     Heart sounds: Normal heart sounds.  Pulmonary:     Effort: Pulmonary effort is normal. No respiratory distress.     Breath sounds: No stridor. No wheezing.  Abdominal:     Palpations: Abdomen is soft.     Tenderness: There is no abdominal tenderness.  Musculoskeletal:     Cervical back: Normal range of motion.     Right lower leg: No edema.     Left lower leg: No edema.     Comments: No C, T, L-spine tenderness.  There is  some left-sided muscular tenderness with a trigger point present.  No bruising  Skin:    General: Skin is warm and dry.     Capillary Refill: Capillary refill takes less than 2 seconds.  Neurological:     Mental Status: He is alert and oriented to person, place, and time. Mental status is at baseline.  Psychiatric:        Mood and Affect: Mood normal.        Behavior: Behavior normal.     ED Results / Procedures / Treatments   Labs (all labs ordered are listed, but only abnormal results are displayed) Labs Reviewed - No data to display  EKG None  Radiology No results found.  Procedures Procedures    Medications Ordered in ED Medications - No data to display  ED Course/ Medical Decision Making/ A&P                             Medical Decision Making Risk Prescription drug management.    Patient is a 57 year old male with a past medical history  significant for hypertension presenting emergency room today with complaints of some lower back pain that occurred after he slipped on some ice earlier today.  He states that he did not fall to the ground.  He nearly fell but caught himself by moving his feet quickly and adjusting his center of gravity.  He states he had an achy pain in his back after he did this.  No chest pain difficulty breathing.  No lightheadedness or dizziness.  No traumatic injuries.  He denies any lower or upper extremity pain.  No hematuria chest pain abdominal pain nausea vomiting.  No other associate symptoms he is taken no medications prior to arrival.  Physical exam with trigger point left sided low back muscular tenderness.  There is a trigger point here.  Offered x-ray imaging however we did share decision-making discussion and I did indicate to him that I think this to be low yield given that he did not have a traumatic injury.  I suspect his pain is muscular in etiology.  He is ambulating well.  No midline back pain.  Muscle relaxer or lidocaine patches recommended.  Discharged home.  Return precautions discussed  Final Clinical Impression(s) / ED Diagnoses Final diagnoses:  Back strain, initial encounter    Rx / DC Orders ED Discharge Orders          Ordered    methocarbamol (ROBAXIN) 500 MG tablet  2 times daily        11/20/22 2312              Pati Gallo [place], [place] 11/22/22 0052    Elgie Congo, MD 11/22/22 1601

## 2022-11-20 NOTE — ED Provider Triage Note (Signed)
Emergency Medicine Provider Triage Evaluation Note  Charles Patterson , a 57 y.o. male  was evaluated in triage.  Pt complains of low back pain after almost falling at 2pm today.   Did not fall to the ground. Slipped on ice and caught himself. No injuries. States his back has become stiff.   Review of Systems  Positive: Back pain Negative: Fever   Physical Exam  There were no vitals taken for this visit. Gen:   Awake, no distress   Resp:  Normal effort MSK:   Moves extremities without difficulty  Other:    Medical Decision Making  Medically screening exam initiated at 11:10 PM.  Appropriate orders placed.  Rudean Hitt was informed that the remainder of the evaluation will be completed by another provider, this initial triage assessment does not replace that evaluation, and the importance of remaining in the ED until their evaluation is complete.      Pati Gallo [place], [place] 11/20/22 2312

## 2022-11-20 NOTE — Discharge Instructions (Signed)

## 2022-11-21 ENCOUNTER — Emergency Department (HOSPITAL_COMMUNITY)
Admission: EM | Admit: 2022-11-21 | Discharge: 2022-11-21 | Discharge status: Against Medical Advice | Payer: No Typology Code available for payment source

## 2022-11-22 ENCOUNTER — Other Ambulatory Visit: Payer: Self-pay

## 2022-11-22 ENCOUNTER — Encounter (HOSPITAL_COMMUNITY): Payer: Self-pay

## 2022-11-22 ENCOUNTER — Emergency Department (HOSPITAL_COMMUNITY)
Admission: EM | Admit: 2022-11-22 | Discharge: 2022-11-22 | Disposition: A | Discharge status: Home / Self Care | Payer: No Typology Code available for payment source | Attending: Emergency Medicine | Admitting: Emergency Medicine

## 2022-11-22 DIAGNOSIS — W000XXA Fall on same level due to ice and snow, initial encounter: Secondary | ICD-10-CM | POA: Insufficient documentation

## 2022-11-22 DIAGNOSIS — M25562 Pain in left knee: Secondary | ICD-10-CM | POA: Insufficient documentation

## 2022-11-22 MED ORDER — IBUPROFEN 200 MG PO TABS
600.0000 mg | ORAL_TABLET | Freq: Once | ORAL | Status: AC
  Administered 2022-11-22: 600 mg via ORAL
  Filled 2022-11-22: qty 3

## 2022-11-22 MED ORDER — ACETAMINOPHEN 500 MG PO TABS
1000.0000 mg | ORAL_TABLET | Freq: Once | ORAL | Status: AC
  Administered 2022-11-22: 1000 mg via ORAL
  Filled 2022-11-22: qty 2

## 2022-11-22 NOTE — ED Provider Notes (Signed)
[facility] DEPT Provider Note   CSN: [id number] Arrival date & time: 11/22/22  0148     History  Chief Complaint  Patient presents with   Knee Pain    Charles Patterson is a 57 y.o. male.   Knee Pain  Patient is a 57 year old male present emergency room today with complaints of left knee pain.  I saw this patient 1/17 after a episode where he nearly fell after sliding on some ice.  At the time he was having some left back pain which is now resolved.  He now endorses some left knee pain.  He states he did not twist his knee he did not fall.  He did not strike his head or lose consciousness he did not experience any other injuries besides injuring his left side of his low back and experiencing some left knee pain.  He states that he almost fell but did not he spread his legs and caught his balance but in the effort to stand his feet feels that he strained his knee.  He has been walking since this occurred.    Home Medications Prior to Admission medications   Medication Sig Start Date End Date Taking? Authorizing Provider  benzonatate (TESSALON) 100 MG capsule Take 1 capsule (100 mg total) by mouth 3 (three) times daily as needed for cough. 11/07/22   Sponseller, Eugene Garnet R, PA-C  ibuprofen (ADVIL) 600 MG tablet Take 1 tablet (600 mg total) by mouth every 6 (six) hours as needed. 11/18/22   Domenic Moras, PA-C  lidocaine (HM LIDOCAINE PATCH) 4 % Place 1 patch onto the skin every 12 (twelve) hours as needed. Patient not taking: Reported on 07/14/2022 02/25/22   Loni Beckwith, PA-C  methocarbamol (ROBAXIN) 500 MG tablet Take 1 tablet (500 mg total) by mouth 2 (two) times daily. 11/20/22   Tedd Sias, PA  methylPREDNISolone (MEDROL DOSEPAK) 4 MG TBPK tablet Take as directed on packet 10/20/22   Horton, Barbette Hair, MD      Allergies    Patient has no known allergies.    Review of Systems   Review of Systems  Physical Exam Updated Vital Signs BP  (!) 144/87 (BP Location: Right Arm)   Pulse 82   Temp 98 F (36.7 C) (Oral)   Resp 18   SpO2 100%  Physical Exam Vitals and nursing note reviewed.  Constitutional:      General: He is not in acute distress.    Appearance: Normal appearance. He is not ill-appearing.     Comments: Pleasant, jovial 57 year old gentleman in no acute distress.  HENT:     Head: Normocephalic and atraumatic.  Eyes:     General: No scleral icterus.       Right eye: No discharge.        Left eye: No discharge.     Conjunctiva/sclera: Conjunctivae normal.  Pulmonary:     Effort: Pulmonary effort is normal.     Breath sounds: No stridor.  Musculoskeletal:     Comments: Full range of motion of left knee, negative anterior posterior drawer.  No hip, patellar, knee, ankle, tib-fib, ankle or foot tenderness.  No C, T, L-spine tenderness.  Head without any evidence of trauma.  Skin:    General: Skin is warm and dry.  Neurological:     Mental Status: He is alert and oriented to person, place, and time. Mental status is at baseline.     ED Results / Procedures / Treatments  Labs (all labs ordered are listed, but only abnormal results are displayed) Labs Reviewed - No data to display  EKG None  Radiology No results found.  Procedures Procedures    Medications Ordered in ED Medications  acetaminophen (TYLENOL) tablet 1,000 mg (1,000 mg Oral Given 11/22/22 0235)  ibuprofen (ADVIL) tablet 600 mg (600 mg Oral Given 11/22/22 0235)    ED Course/ Medical Decision Making/ A&P                             Medical Decision Making   Patient is a 57 year old male present emergency room today with complaints of left knee pain.  I saw this patient 1/17 after a episode where he nearly fell after sliding on some ice.  At the time he was having some left back pain which is now resolved.  He now endorses some left knee pain.  He states he did not twist his knee he did not fall.  He did not strike his head or lose  consciousness he did not experience any other injuries besides injuring his left side of his low back and experiencing some left knee pain.  He states that he almost fell but did not he spread his legs and caught his balance but in the effort to stand his feet feels that he strained his knee.  He has been walking since this occurred.   Physical exam is unremarkable he has no bony tenderness of the patella or the knee joint.  No left hip or lower extremity tenderness.  He is ambulatory.  Distally neurovascularly intact with sensation and good proprioception lower extremity.  Offered x-ray although I discussed with him that I have low suspicion that an acute fracture has resulted from this near fall.  Patient states he would prefer not to have an x-ray.  I offered Tylenol and Motrin which he accepts.  He states he would like to follow-up with an orthopedist at the [place] where he is already established.  Will discharge patient at this time.  All questions answered to the best my ability.    Final Clinical Impression(s) / ED Diagnoses Final diagnoses:  Acute pain of left knee    Rx / DC Orders ED Discharge Orders     None         Tedd Sias, [place] 11/22/22 0237    Orpah Greek, MD 11/23/22 [phone]

## 2022-11-22 NOTE — ED Triage Notes (Signed)
Pt reports with left knee pain since yesterday. Pt states that he slipped on some ice. Pt is ambulatory.

## 2022-11-22 NOTE — Discharge Instructions (Addendum)
Ice your knee and take Tylenol and ibuprofen as discussed below.  Follow-up with an orthopedist at the [facility] if your symptoms/knee pain continues for more than 4 weeks.  Please use Tylenol or ibuprofen for pain.  You may use 600 mg ibuprofen every 6 hours or 1000 mg of Tylenol every 6 hours.  You may choose to alternate between the 2.  This would be most effective.  Not to exceed 4 g of Tylenol within 24 hours.  Not to exceed 3200 mg ibuprofen 24 hours.

## 2022-11-23 ENCOUNTER — Other Ambulatory Visit: Payer: Self-pay

## 2022-11-23 ENCOUNTER — Emergency Department (HOSPITAL_COMMUNITY)
Admission: EM | Admit: 2022-11-23 | Discharge: 2022-11-23 | Discharge status: Against Medical Advice | Payer: No Typology Code available for payment source | Source: Home / Self Care

## 2022-11-23 ENCOUNTER — Emergency Department (HOSPITAL_COMMUNITY)
Admission: EM | Admit: 2022-11-23 | Discharge: 2022-11-24 | Disposition: A | Discharge status: Home / Self Care | Payer: No Typology Code available for payment source | Attending: Emergency Medicine | Admitting: Emergency Medicine

## 2022-11-23 DIAGNOSIS — M542 Cervicalgia: Secondary | ICD-10-CM | POA: Diagnosis present

## 2022-11-23 NOTE — ED Notes (Signed)
Pt called to be triaged, no response  

## 2022-11-23 NOTE — ED Triage Notes (Signed)
Pt c/o neck pain ongoing throughout the day. No injury/trauma. Mobility intact.

## 2022-11-24 DIAGNOSIS — M542 Cervicalgia: Secondary | ICD-10-CM | POA: Diagnosis not present

## 2022-11-24 MED ORDER — IBUPROFEN 800 MG PO TABS
800.0000 mg | ORAL_TABLET | Freq: Once | ORAL | Status: AC
  Administered 2022-11-24: 800 mg via ORAL
  Filled 2022-11-24: qty 1

## 2022-11-24 MED ORDER — METHOCARBAMOL 500 MG PO TABS: 500.0000 mg | ORAL_TABLET | Freq: Two times a day (BID) | ORAL | 0 refills | Status: DC

## 2022-11-24 MED ORDER — IBUPROFEN 600 MG PO TABS: 600.0000 mg | ORAL_TABLET | Freq: Four times a day (QID) | ORAL | 0 refills | Status: AC | PRN

## 2022-11-24 MED ORDER — METHOCARBAMOL 500 MG PO TABS
1000.0000 mg | ORAL_TABLET | Freq: Once | ORAL | Status: AC
  Administered 2022-11-24: 1000 mg via ORAL
  Filled 2022-11-24: qty 2

## 2022-11-24 NOTE — ED Provider Notes (Signed)
[facility] Provider Note   CSN: [id number] Arrival date & time: 11/23/22  2140     History  Chief Complaint  Patient presents with   Neck Pain    Charles Patterson is a 57 y.o. male.  57 year old male very well-known to apartment the presents the ER for "crick in my neck".  States he might of slept on it wrong.  No fevers.  No decreased range of motion.  No trouble swallowing or breathing.  Asked for some Motrin.  No other complaints anywhere else.   Neck Pain      Home Medications Prior to Admission medications   Medication Sig Start Date End Date Taking? Authorizing Provider  acetaminophen (TYLENOL) 500 MG tablet Take 500 mg by mouth every 6 (six) hours as needed for mild pain.   Yes [provider]  ibuprofen (ADVIL) 600 MG tablet Take 1 tablet (600 mg total) by mouth every 6 (six) hours as needed. 11/24/22   Wiley Magan, Corene Cornea, MD  methocarbamol (ROBAXIN) 500 MG tablet Take 1 tablet (500 mg total) by mouth 2 (two) times daily. 11/24/22   Lucendia Leard, Corene Cornea, MD      Allergies    Patient has no known allergies.    Review of Systems   Review of Systems  Musculoskeletal:  Positive for neck pain.    Physical Exam Updated Vital Signs BP 118/72   Pulse 79   Temp 98.5 F (36.9 C)   Resp 16   Ht 5\' 7"  (1.702 m)   Wt 83 kg   SpO2 97%   BMI 28.66 kg/m  Physical Exam Vitals and nursing note reviewed.  Constitutional:      Appearance: He is well-developed.  HENT:     Head: Normocephalic and atraumatic.  Eyes:     Pupils: Pupils are equal, round, and reactive to light.  Cardiovascular:     Rate and Rhythm: Normal rate.  Pulmonary:     Effort: Pulmonary effort is normal. No respiratory distress.  Abdominal:     General: There is no distension.  Musculoskeletal:        General: Tenderness (left paracervical, mild spasm noted) present. Normal range of motion.     Cervical back: Normal range of motion.  Neurological:      General: No focal deficit present.     Mental Status: He is alert.     ED Results / Procedures / Treatments   Labs (all labs ordered are listed, but only abnormal results are displayed) Labs Reviewed - No data to display  EKG None  Radiology No results found.  Procedures Procedures    Medications Ordered in ED Medications  methocarbamol (ROBAXIN) tablet 1,000 mg (has no administration in time range)  ibuprofen (ADVIL) tablet 800 mg (has no administration in time range)    ED Course/ Medical Decision Making/ A&P                             Medical Decision Making Risk Prescription drug management.   Without infectious symptoms, headache or nuchal rigidity have low suspicion for meningitis.  No trauma to suggest need for imaging.  No airway or other issues to be concern for deep neck space infection or other acute emergent etiologies.  Will treat symptomatically and discharge.        Final Clinical Impression(s) / ED Diagnoses Final diagnoses:  Neck pain    Rx / DC  Orders ED Discharge Orders          Ordered    methocarbamol (ROBAXIN) 500 MG tablet  2 times daily        11/24/22 0112    ibuprofen (ADVIL) 600 MG tablet  Every 6 hours PRN        11/24/22 0112              Andrewjames Weirauch, Corene Cornea, MD 11/24/22 6387

## 2022-11-30 ENCOUNTER — Emergency Department (HOSPITAL_COMMUNITY)
Admission: EM | Admit: 2022-11-30 | Discharge: 2022-12-01 | Disposition: A | Discharge status: Home / Self Care | Payer: No Typology Code available for payment source | Attending: Student | Admitting: Student

## 2022-11-30 ENCOUNTER — Other Ambulatory Visit: Payer: Self-pay

## 2022-11-30 ENCOUNTER — Emergency Department (HOSPITAL_COMMUNITY): Payer: No Typology Code available for payment source

## 2022-11-30 DIAGNOSIS — M25532 Pain in left wrist: Secondary | ICD-10-CM | POA: Diagnosis present

## 2022-11-30 DIAGNOSIS — W1840XA Slipping, tripping and stumbling without falling, unspecified, initial encounter: Secondary | ICD-10-CM | POA: Diagnosis not present

## 2022-11-30 DIAGNOSIS — S63502A Unspecified sprain of left wrist, initial encounter: Secondary | ICD-10-CM | POA: Diagnosis not present

## 2022-11-30 NOTE — ED Triage Notes (Signed)
Patient tripped and injured his left wrist this afternoon . No deformity .

## 2022-11-30 NOTE — ED Provider Triage Note (Signed)
  Emergency Medicine Provider Triage Evaluation Note  MRN:  [id number]  Arrival date & time: 11/30/22    Medically screening exam initiated at 11:03 PM.   CC:   Wrist Injury   HPI:  KOA ZOELLER is a 57 y.o. year-old male presents to the ED with chief complaint of foosh.  Tripped around 2:00 today.  Complains of left wrist pain.Marland Kitchen  History provided by patient. ROS:  -As included in HPI PE:   Vitals:   11/30/22 2301  BP: (!) 147/87  Pulse: 89  Resp: 20  Temp: 98.5 F (36.9 C)  SpO2: 97%    Non-toxic appearing No respiratory distress Increased pain with movement. MDM:  Based on signs and symptoms, sprain is highest on my differential, followed by fx. I've ordered imaging in triage to expedite lab/diagnostic workup.  Patient was informed that the remainder of the evaluation will be completed by another provider, this initial triage assessment does not replace that evaluation, and the importance of remaining in the ED until their evaluation is complete.    Montine Circle, PA-C 11/30/22 2304

## 2022-12-01 NOTE — ED Provider Notes (Signed)
  [facility] Emergency Department Provider Note MRN:  [id number]  Arrival date & time: 12/01/22     Chief Complaint   Wrist Injury   History of Present Illness   Charles Patterson is a 57 y.o. year-old male presents to the ED with chief complaint of left wrist pain.  States that he had a [place].  Tripped earlier today.  Denies other injuries.  Reports increased pain with movement of the wrist.  History provided by patient.   Review of Systems  Pertinent positive and negative review of systems noted in HPI.    Physical Exam   Vitals:   11/30/22 2301  BP: (!) 147/87  Pulse: 89  Resp: 20  Temp: 98.5 F (36.9 C)  SpO2: 97%    CONSTITUTIONAL:  well-appearing, NAD NEURO:  Alert and oriented x 3, CN 3-12 grossly intact EYES:  eyes equal and reactive ENT/NECK:  Supple, no stridor  CARDIO:  appears well-perfused  PULM:  No respiratory distress,  GI/GU:  non-distended,  MSK/SPINE:  No gross deformities, no edema, moves all extremities  SKIN:  no rash, atraumatic   *Additional and/or pertinent findings included in MDM below  Diagnostic and Interventional Summary    EKG Interpretation  Date/Time:    Ventricular Rate:    PR Interval:    QRS Duration:   QT Interval:    QTC Calculation:   R Axis:     Text Interpretation:         Labs Reviewed - No data to display  DG Wrist Complete Left  Final Result      Medications - No data to display   Procedures  /  Critical Care Procedures  ED Course and Medical Decision Making  I have reviewed the triage vital signs, the nursing notes, and pertinent available records from the EMR.  Social Determinants Affecting Complexity of Care: Patient has no clinically significant social determinants affecting this chief complaint..   ED Course:    Medical Decision Making Amount and/or Complexity of Data Reviewed Radiology: ordered and independent interpretation performed.    Details: No visible fx  or dislocation     Consultants: No consultations were needed in caring for this patient.   Treatment and Plan: Emergency department workup does not suggest an emergent condition requiring admission or immediate intervention beyond  what has been performed at this time. The patient is safe for discharge and has  been instructed to return immediately for worsening symptoms, change in  symptoms or any other concerns    Final Clinical Impressions(s) / ED Diagnoses     ICD-10-CM   1. Sprain of left wrist, initial encounter  B71.696V       ED Discharge Orders     None         Discharge Instructions Discussed with and Provided to Patient:   Discharge Instructions   None      Montine Circle, PA-C 12/01/22 [place], [place], MD 12/01/22 [phone]

## 2022-12-02 ENCOUNTER — Other Ambulatory Visit: Payer: Self-pay

## 2022-12-02 ENCOUNTER — Emergency Department (HOSPITAL_COMMUNITY)
Admission: EM | Admit: 2022-12-02 | Discharge: 2022-12-03 | Disposition: A | Discharge status: Home / Self Care | Payer: No Typology Code available for payment source | Attending: Emergency Medicine | Admitting: Emergency Medicine

## 2022-12-02 DIAGNOSIS — M545 Low back pain, unspecified: Secondary | ICD-10-CM | POA: Diagnosis not present

## 2022-12-02 NOTE — ED Triage Notes (Signed)
Pt c/o acute lower back pain after bending since 1430 today.

## 2022-12-02 NOTE — ED Provider Triage Note (Signed)
Emergency Medicine Provider Triage Evaluation Note  Charles Patterson , a 57 y.o. male  was evaluated in triage.  Pt complains of lower back pain starting today at 2:30 PM.  States that he was doing squats.  Pain is worse with movement.  No lower extremity weakness, numbness, or tingling.  He is ambulatory.  He has taken Tylenol without improvement.  Patient has had 27 previous visits in the past 6 months.  Review of Systems  Positive: Low back pain Negative: Weakness  Physical Exam  BP (!) 141/82 (BP Location: Left Arm)   Pulse 98   Temp 98.6 F (37 C) (Oral)   Resp 18   SpO2 98%  Gen:   Awake, no distress   Resp:  Normal effort  MSK:   Moves extremities without difficulty  Other:  Pain across the bilateral lower lumbar spine  Medical Decision Making  Medically screening exam initiated at 6:32 PM.  Appropriate orders placed.  Charles Patterson was informed that the remainder of the evaluation will be completed by another provider, this initial triage assessment does not replace that evaluation, and the importance of remaining in the ED until their evaluation is complete.     Charles Cater, PA-C 12/02/22 1610

## 2022-12-03 NOTE — ED Provider Notes (Signed)
[place] EMERGENCY DEPARTMENT AT [facility] Provider Note   CSN: [id number] Arrival date & time: 12/02/22  1814     History  Chief Complaint  Patient presents with   Back Pain    Charles Patterson is a 57 y.o. male.  The history is provided by the patient and medical records.  Back Pain  57 y.o. M presenting to the ED with back pain after doing squats earlier today.  Pain worse with movement.  Denies numbness/weakness of the legs, remains ambulatory.  He reportedly did take tylenol PTA without change.    Patient well known to the ED, 27 visits in the past 6 months for various pain related complaints.  Home Medications Prior to Admission medications   Medication Sig Start Date End Date Taking? Authorizing Provider  acetaminophen (TYLENOL) 500 MG tablet Take 500 mg by mouth every 6 (six) hours as needed for mild pain.    [provider]  ibuprofen (ADVIL) 600 MG tablet Take 1 tablet (600 mg total) by mouth every 6 (six) hours as needed. 11/24/22   Mesner, Corene Cornea, MD  methocarbamol (ROBAXIN) 500 MG tablet Take 1 tablet (500 mg total) by mouth 2 (two) times daily. 11/24/22   Mesner, Corene Cornea, MD      Allergies    Patient has no known allergies.    Review of Systems   Review of Systems  Musculoskeletal:  Positive for back pain.  All other systems reviewed and are negative.   Physical Exam Updated Vital Signs BP (!) 176/92   Pulse 83   Temp 97.8 F (36.6 C) (Oral)   Resp 18   SpO2 93%   Physical Exam Vitals and nursing note reviewed.  Constitutional:      Appearance: He is well-developed.     Comments: Sleeping, does not really wake up and answer questions when spoken to  HENT:     Head: Normocephalic and atraumatic.  Eyes:     Conjunctiva/sclera: Conjunctivae normal.     Pupils: Pupils are equal, round, and reactive to light.  Cardiovascular:     Rate and Rhythm: Normal rate and regular rhythm.     Heart sounds: Normal heart sounds.   Pulmonary:     Effort: Pulmonary effort is normal.     Breath sounds: Normal breath sounds.  Abdominal:     General: Bowel sounds are normal.     Palpations: Abdomen is soft.  Musculoskeletal:        General: Normal range of motion.     Cervical back: Normal range of motion.     Comments: Ambulatory from waiting room, no apparent limping or weakness observed  Skin:    General: Skin is warm and dry.  Neurological:     Mental Status: He is alert and oriented to person, place, and time.     ED Results / Procedures / Treatments   Labs (all labs ordered are listed, but only abnormal results are displayed) Labs Reviewed - No data to display  EKG None  Radiology No results found.  Procedures Procedures    Medications Ordered in ED Medications - No data to display  ED Course/ Medical Decision Making/ A&P                             Medical Decision Making  57 year old male presenting to the ED with back pain that began today after doing squats.  Patient is well-known to this  hospital system, seen 27 times in the past 6 months for various pain related complaints.  He is sleeping on my exam and does not really wake up and answer a lot of questions for me.  He remains ambulatory here in the ED.  He does not have any apparent focal deficits.  I do not feel he needs further workup at this time and is stable for discharge with symptomatic care.  Can return here for new concerns.  Final Clinical Impression(s) / ED Diagnoses Final diagnoses:  Acute low back pain without sciatica, unspecified back pain laterality    Rx / DC Orders ED Discharge Orders     None         Larene Pickett, PA-C 12/03/22 0527    Fatima Blank, MD 12/03/22 [phone]

## 2022-12-03 NOTE — Discharge Instructions (Signed)
Can continue tylenol or motrin as needed. Can use heating pad or ice as well if desired. Return here for new concerns.

## 2022-12-14 ENCOUNTER — Encounter (HOSPITAL_COMMUNITY): Payer: Self-pay | Admitting: *Deleted

## 2022-12-14 ENCOUNTER — Other Ambulatory Visit: Payer: Self-pay

## 2022-12-14 ENCOUNTER — Emergency Department (HOSPITAL_COMMUNITY)
Admission: EM | Admit: 2022-12-14 | Discharge: 2022-12-15 | Discharge status: Against Medical Advice | Payer: No Typology Code available for payment source | Attending: Emergency Medicine | Admitting: Emergency Medicine

## 2022-12-14 DIAGNOSIS — Z5321 Procedure and treatment not carried out due to patient leaving prior to being seen by health care provider: Secondary | ICD-10-CM | POA: Diagnosis not present

## 2022-12-14 DIAGNOSIS — R059 Cough, unspecified: Secondary | ICD-10-CM | POA: Diagnosis present

## 2022-12-14 NOTE — ED Triage Notes (Signed)
The pt is here for a cough that he has had for 10 days  productive cough  no temp  he is a frequenter here

## 2022-12-15 NOTE — ED Notes (Signed)
NA for vitals x3

## 2022-12-17 ENCOUNTER — Emergency Department (HOSPITAL_COMMUNITY)
Admission: EM | Admit: 2022-12-17 | Discharge: 2022-12-17 | Disposition: A | Discharge status: Home / Self Care | Payer: Medicaid Other | Attending: Emergency Medicine | Admitting: Emergency Medicine

## 2022-12-17 ENCOUNTER — Emergency Department (HOSPITAL_COMMUNITY): Payer: Medicaid Other

## 2022-12-17 ENCOUNTER — Other Ambulatory Visit: Payer: Self-pay

## 2022-12-17 ENCOUNTER — Encounter (HOSPITAL_COMMUNITY): Payer: Self-pay

## 2022-12-17 DIAGNOSIS — Z20822 Contact with and (suspected) exposure to covid-19: Secondary | ICD-10-CM | POA: Insufficient documentation

## 2022-12-17 DIAGNOSIS — R051 Acute cough: Secondary | ICD-10-CM

## 2022-12-17 DIAGNOSIS — R059 Cough, unspecified: Secondary | ICD-10-CM | POA: Diagnosis present

## 2022-12-17 DIAGNOSIS — I10 Essential (primary) hypertension: Secondary | ICD-10-CM | POA: Diagnosis not present

## 2022-12-17 LAB — RESP PANEL BY RT-PCR (RSV, FLU A&B, COVID)  RVPGX2
Influenza A by PCR: NEGATIVE
Influenza B by PCR: NEGATIVE
Resp Syncytial Virus by PCR: NEGATIVE
SARS Coronavirus 2 by RT PCR: NEGATIVE

## 2022-12-17 MED ORDER — GUAIFENESIN ER 600 MG PO TB12: 600.0000 mg | ORAL_TABLET | Freq: Two times a day (BID) | ORAL | 0 refills | Status: AC

## 2022-12-17 MED ORDER — GUAIFENESIN 100 MG/5ML PO LIQD
5.0000 mL | Freq: Once | ORAL | Status: AC
  Administered 2022-12-17: 5 mL via ORAL
  Filled 2022-12-17: qty 5

## 2022-12-17 MED ORDER — BENZONATATE 100 MG PO CAPS: 100.0000 mg | ORAL_CAPSULE | Freq: Three times a day (TID) | ORAL | 0 refills | Status: DC

## 2022-12-17 MED ORDER — BENZONATATE 100 MG PO CAPS
200.0000 mg | ORAL_CAPSULE | Freq: Once | ORAL | Status: AC
  Administered 2022-12-17: 200 mg via ORAL
  Filled 2022-12-17: qty 2

## 2022-12-17 MED ORDER — LORATADINE 10 MG PO TABS
10.0000 mg | ORAL_TABLET | Freq: Once | ORAL | Status: AC
  Administered 2022-12-17: 10 mg via ORAL
  Filled 2022-12-17: qty 1
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
## 2022-12-17 NOTE — ED Triage Notes (Signed)
Pt reports productive cough of yellow phlegm x 12 days. Denies any other symptoms.

## 2022-12-25 ENCOUNTER — Encounter (HOSPITAL_COMMUNITY): Payer: Self-pay | Admitting: Emergency Medicine

## 2022-12-25 ENCOUNTER — Other Ambulatory Visit: Payer: Self-pay

## 2022-12-25 ENCOUNTER — Emergency Department (HOSPITAL_COMMUNITY)
Admission: EM | Admit: 2022-12-25 | Discharge: 2022-12-25 | Disposition: A | Discharge status: Home / Self Care | Payer: Medicaid Other | Attending: Emergency Medicine | Admitting: Emergency Medicine

## 2022-12-25 DIAGNOSIS — J069 Acute upper respiratory infection, unspecified: Secondary | ICD-10-CM | POA: Diagnosis not present

## 2022-12-25 DIAGNOSIS — F17200 Nicotine dependence, unspecified, uncomplicated: Secondary | ICD-10-CM | POA: Diagnosis not present

## 2022-12-25 DIAGNOSIS — R059 Cough, unspecified: Secondary | ICD-10-CM | POA: Diagnosis present

## 2022-12-25 NOTE — ED Triage Notes (Signed)
Pt c/o productive cough for approx 10 days unrelieved by otc medications.

## 2022-12-25 NOTE — ED Provider Notes (Signed)
[facility] Provider Note   CSN: [id number] Arrival date & time: 12/25/22  0036     History  Chief Complaint  Patient presents with   Cough    Charles Patterson is a 57 y.o. male.  57 year old male presents with complaint of cough x 10 days.  Reports yellow sputum and postnasal drip.  Denies fevers, chills, wheezing or shortness of breath.  Is a daily smoker.  No history of COPD.  No other complaints or concerns.       Home Medications Prior to Admission medications   Medication Sig Start Date End Date Taking? Authorizing Provider  acetaminophen (TYLENOL) 500 MG tablet Take 500 mg by mouth every 6 (six) hours as needed for mild pain.    [provider]  benzonatate (TESSALON) 100 MG capsule Take 1 capsule (100 mg total) by mouth every 8 (eight) hours. 12/17/22   Palumbo, April, MD  guaiFENesin (MUCINEX) 600 MG 12 hr tablet Take 1 tablet (600 mg total) by mouth 2 (two) times daily. 12/17/22   Palumbo, April, MD  ibuprofen (ADVIL) 600 MG tablet Take 1 tablet (600 mg total) by mouth every 6 (six) hours as needed. 11/24/22   Mesner, Corene Cornea, MD  methocarbamol (ROBAXIN) 500 MG tablet Take 1 tablet (500 mg total) by mouth 2 (two) times daily. 11/24/22   Mesner, Corene Cornea, MD      Allergies    Patient has no known allergies.    Review of Systems   Review of Systems Negative except as per HPI Physical Exam Updated Vital Signs BP (!) 123/97   Pulse 75   Temp (!) 97 F (36.1 C)   Resp 17   Ht 5' 7"$  (1.702 m)   Wt 81.6 kg   SpO2 100%   BMI 28.19 kg/m  Physical Exam Vitals and nursing note reviewed.  Constitutional:      General: He is not in acute distress.    Appearance: He is well-developed. He is not diaphoretic.  HENT:     Head: Normocephalic and atraumatic.     Nose: No congestion.     Mouth/Throat:     Mouth: Mucous membranes are moist.     Pharynx: No oropharyngeal exudate or posterior oropharyngeal erythema.      Comments: Postnasal drip Cardiovascular:     Rate and Rhythm: Normal rate and regular rhythm.     Heart sounds: Normal heart sounds.  Pulmonary:     Effort: Pulmonary effort is normal.     Breath sounds: Normal breath sounds.  Skin:    General: Skin is warm and dry.     Findings: No erythema or rash.  Neurological:     Mental Status: He is alert and oriented to person, place, and time.  Psychiatric:        Behavior: Behavior normal.     ED Results / Procedures / Treatments   Labs (all labs ordered are listed, but only abnormal results are displayed) Labs Reviewed - No data to display  EKG None  Radiology No results found.  Procedures Procedures    Medications Ordered in ED Medications - No data to display  ED Course/ Medical Decision Making/ A&P                             Medical Decision Making  57 year old male with complaint of cough productive with yellow sputum for the past 10 days without  fevers or chills.  Well-appearing on exam, nontoxic in no distress.  Lungs clear to auscultation, mild postnasal drip. Vitals reveal 100% O2 sat on room air.  Chest x-ray from prior visit reviewed, negative for pneumonia.  Recommend saline sinus rinse, Flonase, Zyrtec.  Coricidin HBP as needed for symptom relief.        Final Clinical Impression(s) / ED Diagnoses Final diagnoses:  Viral URI with cough    Rx / DC Orders ED Discharge Orders     None         Tacy Learn, PA-C 12/25/22 0542    Fatima Blank, MD 12/25/22 [phone]

## 2022-12-25 NOTE — Discharge Instructions (Signed)
Recommend saline sinus rinse twice daily. Use Flonase twice daily for 5 days, then continue with daily use. Zyrtec daily. Coricidin HBP as needed for symptom relief.

## 2022-12-31 ENCOUNTER — Other Ambulatory Visit: Payer: Self-pay

## 2022-12-31 ENCOUNTER — Emergency Department (HOSPITAL_COMMUNITY)
Admission: EM | Admit: 2022-12-31 | Discharge: 2022-12-31 | Disposition: A | Discharge status: Home / Self Care | Payer: Medicaid Other | Attending: Emergency Medicine | Admitting: Emergency Medicine

## 2022-12-31 ENCOUNTER — Encounter (HOSPITAL_COMMUNITY): Payer: Self-pay | Admitting: Emergency Medicine

## 2022-12-31 DIAGNOSIS — H60501 Unspecified acute noninfective otitis externa, right ear: Secondary | ICD-10-CM | POA: Diagnosis not present

## 2022-12-31 DIAGNOSIS — H9201 Otalgia, right ear: Secondary | ICD-10-CM | POA: Diagnosis present

## 2022-12-31 DIAGNOSIS — I10 Essential (primary) hypertension: Secondary | ICD-10-CM | POA: Insufficient documentation

## 2022-12-31 MED ORDER — ACETAMINOPHEN 325 MG PO TABS
650.0000 mg | ORAL_TABLET | Freq: Once | ORAL | Status: AC
  Administered 2022-12-31: 650 mg via ORAL
  Filled 2022-12-31: qty 2

## 2022-12-31 MED ORDER — OFLOXACIN 0.3 % OT SOLN: 5.0000 [drp] | Freq: Two times a day (BID) | OTIC | 0 refills | Status: AC

## 2022-12-31 NOTE — ED Notes (Signed)
Called PT multiple times no answer.

## 2022-12-31 NOTE — ED Triage Notes (Signed)
Pt reports right ear pain that has been going on for two weeks.  Denies fevers or other ailments.

## 2022-12-31 NOTE — Discharge Instructions (Signed)
Please use the prescribed ear drops to treat your right ear otitis externa. Follow up in the outpatient setting and return to the ER with any new severe symptoms.
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
## 2022-12-31 NOTE — ED Notes (Signed)
Called PT again multiple times no answer.

## 2023-01-11 ENCOUNTER — Encounter (HOSPITAL_COMMUNITY): Payer: Self-pay | Admitting: *Deleted

## 2023-01-11 ENCOUNTER — Other Ambulatory Visit: Payer: Self-pay

## 2023-01-11 ENCOUNTER — Emergency Department (HOSPITAL_COMMUNITY)
Admission: EM | Admit: 2023-01-11 | Discharge: 2023-01-11 | Disposition: A | Discharge status: Home / Self Care | Payer: Medicaid Other | Attending: Emergency Medicine | Admitting: Emergency Medicine

## 2023-01-11 DIAGNOSIS — H60391 Other infective otitis externa, right ear: Secondary | ICD-10-CM | POA: Diagnosis not present

## 2023-01-11 DIAGNOSIS — H9201 Otalgia, right ear: Secondary | ICD-10-CM | POA: Diagnosis present

## 2023-01-11 MED ORDER — NEOMYCIN-POLYMYXIN-HC 3.5-10000-1 OT SUSP: 4.0000 [drp] | Freq: Three times a day (TID) | OTIC | 0 refills | Status: AC

## 2023-01-11 NOTE — Discharge Instructions (Addendum)
Drops asUse ears.  Follow-up with ENT if no improvement in 1 week.

## 2023-01-11 NOTE — ED Triage Notes (Signed)
Frequent vivitor here tonight he is c/o  rt ear pain for about a week

## 2023-01-11 NOTE — ED Provider Notes (Signed)
[facility] Provider Note   CSN: [id number] Arrival date & time: 01/11/23  2050     History  Chief Complaint  Patient presents with   Otalgia    Charles Patterson is a 57 y.o. male.  Patient presents with right ear discomfort without drainage for about 1 week.  Patient had mild improvement with eardrops given after he was seen at another facility.  Patient denies any fevers chills.  No other symptoms.       Home Medications Prior to Admission medications   Medication Sig Start Date End Date Taking? Authorizing Provider  neomycin-polymyxin-hydrocortisone (CORTISPORIN) 3.5-10000-1 OTIC suspension Place 4 drops into both ears 3 (three) times daily for 6 days. X 7 days 01/11/23 01/17/23 Yes Elnora Morrison, MD  acetaminophen (TYLENOL) 500 MG tablet Take 500 mg by mouth every 6 (six) hours as needed for mild pain.    [provider]  benzonatate (TESSALON) 100 MG capsule Take 1 capsule (100 mg total) by mouth every 8 (eight) hours. 12/17/22   Palumbo, April, MD  guaiFENesin (MUCINEX) 600 MG 12 hr tablet Take 1 tablet (600 mg total) by mouth 2 (two) times daily. 12/17/22   Palumbo, April, MD  ibuprofen (ADVIL) 600 MG tablet Take 1 tablet (600 mg total) by mouth every 6 (six) hours as needed. 11/24/22   Mesner, Corene Cornea, MD  methocarbamol (ROBAXIN) 500 MG tablet Take 1 tablet (500 mg total) by mouth 2 (two) times daily. 11/24/22   Mesner, Corene Cornea, MD      Allergies    Patient has no known allergies.    Review of Systems   Review of Systems  Constitutional:  Negative for chills and fever.  HENT:  Positive for ear pain. Negative for congestion and ear discharge.   Eyes:  Negative for visual disturbance.  Respiratory:  Negative for shortness of breath.   Cardiovascular:  Negative for chest pain.  Gastrointestinal:  Negative for abdominal pain and vomiting.  Genitourinary:  Negative for dysuria and flank pain.  Musculoskeletal:  Negative for  back pain, neck pain and neck stiffness.  Skin:  Negative for rash.  Neurological:  Negative for light-headedness and headaches.    Physical Exam Updated Vital Signs BP 139/76 (BP Location: Right Arm)   Pulse 93   Temp 98.7 F (37.1 C)   Resp 16   Ht '5\' 7"'$  (1.702 m)   Wt 81.6 kg   SpO2 97%   BMI 28.18 kg/m  Physical Exam Vitals and nursing note reviewed.  Constitutional:      General: He is not in acute distress.    Appearance: He is well-developed.  HENT:     Head: Normocephalic and atraumatic.     Comments: Patient has mild erythema of canal no obvious perforation tympanic membrane.  No drainage.  No mastoid tenderness or swelling.  No significant lymphadenopathy.    Mouth/Throat:     Mouth: Mucous membranes are moist.  Eyes:     General:        Right eye: No discharge.        Left eye: No discharge.     Conjunctiva/sclera: Conjunctivae normal.  Neck:     Trachea: No tracheal deviation.  Cardiovascular:     Rate and Rhythm: Normal rate.  Pulmonary:     Effort: Pulmonary effort is normal.  Abdominal:     General: There is no distension.  Musculoskeletal:     Cervical back: Normal range of motion and  neck supple. No rigidity.  Skin:    General: Skin is warm.     Capillary Refill: Capillary refill takes less than 2 seconds.     Findings: No rash.  Neurological:     General: No focal deficit present.     Mental Status: He is alert.     Cranial Nerves: No cranial nerve deficit.  Psychiatric:        Mood and Affect: Mood normal.     ED Results / Procedures / Treatments   Labs (all labs ordered are listed, but only abnormal results are displayed) Labs Reviewed - No data to display  EKG None  Radiology No results found.  Procedures Procedures    Medications Ordered in ED Medications - No data to display  ED Course/ Medical Decision Making/ A&P                             Medical Decision Making Risk Prescription drug management.   Patient  presents with mild right ear discomfort plan for treatment of mild otitis externa.  No signs of serious bacterial infection or mastoiditis.  Discussed eardrops and follow-up with ENT if no improvement given recurrent visit.  Patient comfortable with plan.  Prescription given.        Final Clinical Impression(s) / ED Diagnoses Final diagnoses:  Acute infective otitis externa of right ear    Rx / DC Orders ED Discharge Orders          Ordered    neomycin-polymyxin-hydrocortisone (CORTISPORIN) 3.5-10000-1 OTIC suspension  3 times daily        01/11/23 2140              Elnora Morrison, MD 01/11/23 2142

## 2023-02-16 LAB — AMB RESULTS CONSOLE CBG: Glucose: 142

## 2023-03-03 ENCOUNTER — Encounter: Payer: Self-pay | Admitting: *Deleted
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
## 2023-04-25 ENCOUNTER — Encounter: Payer: Self-pay | Admitting: *Deleted

## 2023-04-25 NOTE — Progress Notes (Signed)
Pt attended 02/16/23 screening event where his b/p was 145/90 and his blood sugar was 142. At the event, the pt documented that he lived in [place] and used the [facility] for his PCP. Pt did not identify any SDOH insecurities at the time of the event. During initial event follow-up, the [facility] team was unable to contact pt by phone and a letter was mailed with PCP info in case needed because initial chart review did not demonstrate any [facility] visits in [facility] to confirm his access to a PCP at the [facility]. However, the letter, sent to his address as he documented it at the time of the event, was returned to [facility]. Chart review continues not to reveal any VA or other PCP documentation in [facility]. Message left during phone contact attempt today but there was no other contact info with which to f/u with pt about PCP and b/p at this time.
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
## 2023-05-11 ENCOUNTER — Encounter (HOSPITAL_COMMUNITY): Payer: Self-pay

## 2023-05-11 ENCOUNTER — Other Ambulatory Visit: Payer: Self-pay

## 2023-05-11 ENCOUNTER — Emergency Department (HOSPITAL_COMMUNITY)
Admission: EM | Admit: 2023-05-11 | Discharge: 2023-05-11 | Disposition: A | Discharge status: Home / Self Care | Payer: Medicaid Other | Attending: Emergency Medicine | Admitting: Emergency Medicine

## 2023-05-11 DIAGNOSIS — M79672 Pain in left foot: Secondary | ICD-10-CM

## 2023-05-11 NOTE — ED Triage Notes (Signed)
Pt complaining of pain in left foot. Said that his achilles region feels stiff and tight.

## 2023-05-11 NOTE — Progress Notes (Signed)
Orthopedic Tech Progress Note Patient Details:  Charles Patterson 1966/05/18 [id number]  Ortho Devices Type of Ortho Device: ASO Ortho Device/Splint Location: lle Ortho Device/Splint Interventions: Ordered, Application, Adjustment  I applied the brace and taught the patient how to apply it. Post Interventions Patient Tolerated: Well Instructions Provided: Care of device, Adjustment of device  Trinna Post 05/11/2023, 11:25 PM

## 2023-05-11 NOTE — ED Provider Notes (Signed)
[place] EMERGENCY DEPARTMENT AT [facility] Provider Note   CSN: [id number] Arrival date & time: 05/11/23  4782     History  Chief Complaint  Patient presents with   Foot Pain    Charles Patterson is a 57 y.o. male.  The history is provided by the patient and medical records.  Foot Pain   57 y.o. M here with left ankle/foot pain.  States pain along lateral left foot and going up towards his achilles.  He injured this area 3 months ago and is still having some issues.  Denies any new falls/trauma.  He states ankle is very stiff when he first gets up from sitting/laying but once he moves around it seems to loosen up.  Home Medications Prior to Admission medications   Medication Sig Start Date End Date Taking? Authorizing Provider  acetaminophen (TYLENOL) 500 MG tablet Take 500 mg by mouth every 6 (six) hours as needed for mild pain.    [provider]  benzonatate (TESSALON) 100 MG capsule Take 1 capsule (100 mg total) by mouth every 8 (eight) hours. 12/17/22   Palumbo, April, MD  guaiFENesin (MUCINEX) 600 MG 12 hr tablet Take 1 tablet (600 mg total) by mouth 2 (two) times daily. 12/17/22   Palumbo, April, MD  ibuprofen (ADVIL) 600 MG tablet Take 1 tablet (600 mg total) by mouth every 6 (six) hours as needed. 11/24/22   Mesner, Barbara Cower, MD  methocarbamol (ROBAXIN) 500 MG tablet Take 1 tablet (500 mg total) by mouth 2 (two) times daily. 11/24/22   Mesner, Barbara Cower, MD      Allergies    Patient has no known allergies.    Review of Systems   Review of Systems  Musculoskeletal:  Positive for arthralgias.  All other systems reviewed and are negative.   Physical Exam Updated Vital Signs BP (!) 160/89 (BP Location: Right Arm)   Pulse 77   Temp 98.2 F (36.8 C) (Oral)   Resp 18   Ht 5\' 7"  (1.702 m)   Wt 77.1 kg   SpO2 99%   BMI 26.63 kg/m   Physical Exam Vitals and nursing note reviewed.  Constitutional:      Appearance: He is well-developed.  HENT:      Head: Normocephalic and atraumatic.  Eyes:     Conjunctiva/sclera: Conjunctivae normal.     Pupils: Pupils are equal, round, and reactive to light.  Cardiovascular:     Rate and Rhythm: Normal rate and regular rhythm.     Heart sounds: Normal heart sounds.  Pulmonary:     Effort: Pulmonary effort is normal.     Breath sounds: Normal breath sounds.  Abdominal:     General: Bowel sounds are normal.     Palpations: Abdomen is soft.  Musculoskeletal:        General: Normal range of motion.     Cervical back: Normal range of motion.     Comments: Left ankle and foot without swelling or acute deformity, achilles appears intact with negative Thompson test; DP pulse intact, normal sensation throughout  Skin:    General: Skin is warm and dry.  Neurological:     Mental Status: He is alert and oriented to person, place, and time.     ED Results / Procedures / Treatments   Labs (all labs ordered are listed, but only abnormal results are displayed) Labs Reviewed - No data to display  EKG None  Radiology No results found.  Procedures Procedures  Medications Ordered in ED Medications - No data to display  ED Course/ Medical Decision Making/ A&P                             Medical Decision Making  57 year old male here with left foot pain.  Remote injury a few months ago and feels like it is aggravated.  Ankle and foot are stiff when first getting up and moving but does improve throughout the day.  He does not have any deformities, Achilles is intact with negative Thompson test.  Do not feel he needs emergent imaging at this time without any new injury or trauma.  He was placed in ASO, continue supportive care.  Can follow-up with PCP.  Return here for new concerns.  Final Clinical Impression(s) / ED Diagnoses Final diagnoses:  Left foot pain    Rx / DC Orders ED Discharge Orders     None         Garlon Hatchet, PA-C 05/11/23 2310    Glyn Ade,  MD 05/11/23 2312

## 2023-07-19 ENCOUNTER — Emergency Department (HOSPITAL_COMMUNITY)
Admission: EM | Admit: 2023-07-19 | Discharge: 2023-07-20 | Disposition: A | Discharge status: Home / Self Care | Payer: No Typology Code available for payment source | Attending: Emergency Medicine | Admitting: Emergency Medicine

## 2023-07-19 ENCOUNTER — Other Ambulatory Visit: Payer: Self-pay

## 2023-07-19 DIAGNOSIS — Z20822 Contact with and (suspected) exposure to covid-19: Secondary | ICD-10-CM | POA: Insufficient documentation

## 2023-07-19 DIAGNOSIS — J069 Acute upper respiratory infection, unspecified: Secondary | ICD-10-CM | POA: Diagnosis not present

## 2023-07-19 DIAGNOSIS — I10 Essential (primary) hypertension: Secondary | ICD-10-CM | POA: Diagnosis not present

## 2023-07-19 DIAGNOSIS — R059 Cough, unspecified: Secondary | ICD-10-CM | POA: Diagnosis present

## 2023-07-19 NOTE — ED Triage Notes (Signed)
Patient reports " head cold" with nasal congestion/ sinus pressure for several days unrelieved by OTC medication. Respirations unlabored / Afebrile.

## 2023-07-20 DIAGNOSIS — J069 Acute upper respiratory infection, unspecified: Secondary | ICD-10-CM | POA: Diagnosis not present

## 2023-07-20 LAB — SARS CORONAVIRUS 2 BY RT PCR: SARS Coronavirus 2 by RT PCR: NEGATIVE

## 2023-07-20 MED ORDER — BENZONATATE 100 MG PO CAPS: 100.0000 mg | ORAL_CAPSULE | Freq: Three times a day (TID) | ORAL | 0 refills | Status: DC | PRN

## 2023-07-20 MED ORDER — FLUTICASONE PROPIONATE 50 MCG/ACT NA SUSP: 2.0000 | Freq: Every day | NASAL | 0 refills | Status: AC

## 2023-07-20 NOTE — ED Provider Notes (Signed)
Emergency Department Provider Note   I have reviewed the triage vital signs and the nursing notes.   HISTORY  Chief Complaint Nasal Congestion /Sinus Pressure   HPI Charles Patterson is a 57 y.o. male past history of hypertension presents to the emergency department with sinus congestion and cough.  Patient has had symptoms for the past week.  He has not taken any over-the-counter medications to help symptoms.  No fevers.  No shortness of breath or chest pain.    Past Medical History:  Diagnosis Date   Hypertension    Wrist fracture 1995   left    Review of Systems  Constitutional: No fever/chills ENT: No sore throat. Positive sinus congestion.  Cardiovascular: Denies chest pain. Respiratory: Denies shortness of breath. Positive cough.  Gastrointestinal: No abdominal pain.  No nausea, no vomiting.  Musculoskeletal: Negative for back pain. Skin: Negative for rash. Neurological: Negative for headaches.   ____________________________________________   PHYSICAL EXAM:  VITAL SIGNS: ED Triage Vitals  Encounter Vitals Group     BP 07/19/23 2255 131/75     Pulse Rate 07/19/23 2255 96     Resp 07/19/23 2255 18     Temp 07/19/23 2255 98.6 F (37 C)     Temp Source 07/19/23 2255 Oral     SpO2 07/19/23 2255 100 %   Constitutional: Alert and oriented. Well appearing and in no acute distress. Eyes: Conjunctivae are normal.  Head: Atraumatic. Nose: Positive congestion/rhinnorhea. Mouth/Throat: Mucous membranes are moist.   Neck: No stridor.  Cardiovascular: Normal rate, regular rhythm. Good peripheral circulation. Grossly normal heart sounds.   Respiratory: Normal respiratory effort.  No retractions. Lungs CTAB. Gastrointestinal: No distention.  Musculoskeletal: No gross deformities of extremities. Neurologic:  Normal speech and language.  Skin:  Skin is warm, dry and intact. No rash noted.  ____________________________________________   LABS (all labs ordered  are listed, but only abnormal results are displayed)  Labs Reviewed  SARS CORONAVIRUS 2 BY RT PCR   ____________________________________________   PROCEDURES  Procedure(s) performed:   Procedures  None  ____________________________________________   INITIAL IMPRESSION / ASSESSMENT AND PLAN / ED COURSE  Pertinent labs & imaging results that were available during my care of the patient were reviewed by me and considered in my medical decision making (see chart for details).   This patient is Presenting for Evaluation of cough/congestion, which does require a range of treatment options, and is a complaint that involves a moderate risk of morbidity and mortality.  The Differential Diagnoses include COVID, Flu, RSV, CAP, CHF, etc   Clinical Laboratory Tests Ordered, included COVID PCR negative.   Radiologic Tests: Considered CXR but no abnormal lung sounds, increased WOB, or hypoxemia. Defer imaging for now.   Medical Decision Making: Summary:  Patient presents emergency department with cough, congestion.  No acute distress.  Normal vitals.  Clear lungs.  COVID-negative.  Plan for supportive care medications to the pharmacy and PCP follow-up.  Patient's presentation is most consistent with acute, uncomplicated illness.   Disposition: discharge  ____________________________________________  FINAL CLINICAL IMPRESSION(S) / ED DIAGNOSES  Final diagnoses:  Upper respiratory tract infection, unspecified type     NEW OUTPATIENT MEDICATIONS STARTED DURING THIS VISIT:  New Prescriptions   BENZONATATE (TESSALON) 100 MG CAPSULE    Take 1 capsule (100 mg total) by mouth 3 (three) times daily as needed for cough.   FLUTICASONE (FLONASE) 50 MCG/ACT NASAL SPRAY    Place 2 sprays into both nostrils daily for 7  days.    Note:  This document was prepared using Dragon voice recognition software and may include unintentional dictation errors.  Alona Bene, MD, [facility] Emergency  Medicine    Victora Irby, Arlyss Repress, MD 07/20/23 [phone]

## 2023-07-20 NOTE — ED Notes (Signed)
Discharge instructions discussed with pt. Verbalized understanding. VSS. No questions or concerns regarding discharge  

## 2023-08-07 ENCOUNTER — Emergency Department (HOSPITAL_COMMUNITY)
Admission: EM | Admit: 2023-08-07 | Discharge: 2023-08-07 | Disposition: A | Discharge status: Home / Self Care | Payer: No Typology Code available for payment source | Attending: Emergency Medicine | Admitting: Emergency Medicine

## 2023-08-07 ENCOUNTER — Other Ambulatory Visit: Payer: Self-pay

## 2023-08-07 DIAGNOSIS — M79672 Pain in left foot: Secondary | ICD-10-CM | POA: Diagnosis present

## 2023-08-07 MED ORDER — ACETAMINOPHEN 500 MG PO TABS
1000.0000 mg | ORAL_TABLET | Freq: Once | ORAL | Status: AC
  Administered 2023-08-07: 1000 mg via ORAL
  Filled 2023-08-07: qty 2

## 2023-08-07 NOTE — ED Notes (Signed)
Orthopedic technician consulted for ASO lace-up ankle brace dispensation

## 2023-08-07 NOTE — Discharge Instructions (Signed)
Follow-up with your primary care doctor and orthopedics.

## 2023-08-07 NOTE — ED Triage Notes (Signed)
Patient reports left heel pain onset this morning , denies recent injury or fall/ambulatory .

## 2023-08-07 NOTE — ED Provider Notes (Signed)
[place] EMERGENCY DEPARTMENT AT [facility] Provider Note   CSN: [id number] Arrival date & time: 08/07/23  2142     History  Chief Complaint  Patient presents with   Left Heel Pain     Charles Patterson is a 57 y.o. male.  HPI 57 year old male history of hypertension presenting for left ankle pain he states for few months has had pain to the inside aspect of his left heel.  He was seen here in July for the same.  No imaging performed was placed in an ASO brace and discharge.  He feels that the brace was working but he lost it.  He has not yet followed up with his PCP or orthopedics but does have the number.  Today he was squatting when he was squatting down he felt like it hurt a little bit more on the medial aspect of his right heel.  No pain over the Achilles.  He is able to walk without difficulty.  No falls or trauma or swelling.  No history of blood clots.  No wounds.     Home Medications Prior to Admission medications   Medication Sig Start Date End Date Taking? Authorizing Provider  acetaminophen (TYLENOL) 500 MG tablet Take 500 mg by mouth every 6 (six) hours as needed for mild pain.    [provider]  benzonatate (TESSALON) 100 MG capsule Take 1 capsule (100 mg total) by mouth 3 (three) times daily as needed for cough. 07/20/23   Long, Arlyss Repress, MD  fluticasone (FLONASE) 50 MCG/ACT nasal spray Place 2 sprays into both nostrils daily for 7 days. 07/20/23 07/27/23  Long, Arlyss Repress, MD  guaiFENesin (MUCINEX) 600 MG 12 hr tablet Take 1 tablet (600 mg total) by mouth 2 (two) times daily. 12/17/22   Palumbo, April, MD  ibuprofen (ADVIL) 600 MG tablet Take 1 tablet (600 mg total) by mouth every 6 (six) hours as needed. 11/24/22   Mesner, Barbara Cower, MD  methocarbamol (ROBAXIN) 500 MG tablet Take 1 tablet (500 mg total) by mouth 2 (two) times daily. 11/24/22   Mesner, Barbara Cower, MD      Allergies    Patient has no known allergies.    Review of Systems   Review of  Systems Review of systems completed and notable as per HPI.  ROS otherwise negative.   Physical Exam Updated Vital Signs BP 135/80 (BP Location: Right Arm)   Pulse 80   Temp 98 F (36.7 C) (Oral)   Resp (!) 24   SpO2 97%  Physical Exam Vitals and nursing note reviewed.  Constitutional:      General: He is not in acute distress.    Appearance: He is well-developed.  HENT:     Head: Normocephalic and atraumatic.  Eyes:     Conjunctiva/sclera: Conjunctivae normal.  Cardiovascular:     Rate and Rhythm: Normal rate and regular rhythm.     Heart sounds: No murmur heard. Pulmonary:     Effort: Pulmonary effort is normal. No respiratory distress.     Breath sounds: Normal breath sounds.  Abdominal:     Palpations: Abdomen is soft.     Tenderness: There is no abdominal tenderness.  Musculoskeletal:        General: No swelling.     Cervical back: Neck supple.     Comments: Mild tenderness over the medial aspect of the left heel.  No skin changes or swelling or erythema.  No tenderness over the Achilles, he is got  great strength with plantarflexion without pain.  Negative Thompson test.  Palpable DP and PT pulse.  Ambulates without difficulty.  No tenderness over the medial or lateral malleolus.  Skin:    General: Skin is warm and dry.     Capillary Refill: Capillary refill takes less than 2 seconds.  Neurological:     Mental Status: He is alert.  Psychiatric:        Mood and Affect: Mood normal.     ED Results / Procedures / Treatments   Labs (all labs ordered are listed, but only abnormal results are displayed) Labs Reviewed - No data to display  EKG None  Radiology No results found.  Procedures Procedures    Medications Ordered in ED Medications  acetaminophen (TYLENOL) tablet 1,000 mg (has no administration in time range)    ED Course/ Medical Decision Making/ A&P                                 Medical Decision Making  Medical Decision Making:   Charles Patterson is a 57 y.o. male who presented to the ED today with acute on chronic left heel pain.  Vital signs reviewed.  On exam he is well-appearing.  Squatted today and developed some worsening of his chronic left heel pain.  He has not had any trauma is ambulated without difficulty, I do not think he needs emergent imaging as I have low concern for fracture or malalignment.  I do not see any signs of infection, DVT or soft tissue injury.  I suspect he has mild strain.  Brace was helpful before, will give him another brace today and recommended follow-up with orthopedics.  He is comfortable this plan.  Discharged in stable condition.  Patient's presentation is most consistent with exacerbation of chronic illness.           Final Clinical Impression(s) / ED Diagnoses Final diagnoses:  Pain of left heel    Rx / DC Orders ED Discharge Orders     None         Laurence Spates, MD 08/07/23 2250

## 2023-08-27 ENCOUNTER — Encounter: Payer: Self-pay | Admitting: *Deleted

## 2023-08-27 NOTE — Progress Notes (Signed)
And his A1C was t attended 02/16/23 screening event where his blood pressure was 145/90 and his blood sugar was 142. At the event, the pt identified the [place] as his PCP and did not identify any SDOH. At the event, the pt noted he had [employer] insurance. Health equity team member unable to contact pt after the event and letter sent to his last known address was returned. During the 60 day f/u, health equity team again unable to reach pt by phone and chart review did not reveal any VA contact. However, during 6 month f/u, chart review indicates pt has been attending [facility] community screening events and being f/u by [employer] workers who have provided him SDOH resources and screened him for b/p, blood sugar/A!C and lipids,  and his blood sugar was /with the last contact being documented at an event on where his b/p was 5.3 and CHW provided pt info on housing, primary care, and food resources.No additional health equity team support scheduled at this time.

## 2023-10-05 ENCOUNTER — Other Ambulatory Visit: Payer: Self-pay

## 2023-10-05 DIAGNOSIS — M79672 Pain in left foot: Secondary | ICD-10-CM | POA: Insufficient documentation

## 2023-10-06 ENCOUNTER — Encounter (HOSPITAL_COMMUNITY): Payer: Self-pay

## 2023-10-06 ENCOUNTER — Other Ambulatory Visit: Payer: Self-pay

## 2023-10-06 ENCOUNTER — Emergency Department (HOSPITAL_COMMUNITY)
Admission: EM | Admit: 2023-10-06 | Discharge: 2023-10-06 | Disposition: A | Discharge status: Home / Self Care | Payer: No Typology Code available for payment source | Attending: Emergency Medicine | Admitting: Emergency Medicine

## 2023-10-06 DIAGNOSIS — M79672 Pain in left foot: Secondary | ICD-10-CM

## 2023-10-06 NOTE — ED Triage Notes (Signed)
Pt presents via POV c/o left foot pain after working out today. Denies injury.

## 2023-10-06 NOTE — ED Notes (Signed)
 Pt verbalized understanding of discharge instructions. Pt ambulated from ed.

## 2023-10-06 NOTE — ED Provider Notes (Signed)
[place] EMERGENCY DEPARTMENT AT [facility] Provider Note   CSN: [id number] Arrival date & time: 10/05/23  2349     History  Chief Complaint  Patient presents with   Foot Pain    Charles Patterson is a 57 y.o. male.  The history is provided by the patient and medical records.  Foot Pain   57 y.o. M here with left foot pain.  Has hx of plantar fascitis and states he feels it is irritated after exercising yesterday.  Pain along sole of the foot (mostly near the heel), feels tight when trying to walk.  He has tried stretching without relief.  States he used to have a lace up brace which helped a lot but he misplaced it.  He is requesting a new one.  Home Medications Prior to Admission medications   Medication Sig Start Date End Date Taking? Authorizing Provider  acetaminophen (TYLENOL) 500 MG tablet Take 500 mg by mouth every 6 (six) hours as needed for mild pain.    [provider]  benzonatate (TESSALON) 100 MG capsule Take 1 capsule (100 mg total) by mouth 3 (three) times daily as needed for cough. 07/20/23   Long, Arlyss Repress, MD  fluticasone (FLONASE) 50 MCG/ACT nasal spray Place 2 sprays into both nostrils daily for 7 days. 07/20/23 07/27/23  Long, Arlyss Repress, MD  guaiFENesin (MUCINEX) 600 MG 12 hr tablet Take 1 tablet (600 mg total) by mouth 2 (two) times daily. 12/17/22   Palumbo, April, MD  ibuprofen (ADVIL) 600 MG tablet Take 1 tablet (600 mg total) by mouth every 6 (six) hours as needed. 11/24/22   Mesner, Barbara Cower, MD  methocarbamol (ROBAXIN) 500 MG tablet Take 1 tablet (500 mg total) by mouth 2 (two) times daily. 11/24/22   Mesner, Barbara Cower, MD      Allergies    Patient has no known allergies.    Review of Systems   Review of Systems  Musculoskeletal:  Positive for arthralgias.  All other systems reviewed and are negative.   Physical Exam Updated Vital Signs BP (!) 155/85 (BP Location: Right Arm)   Pulse 92   Temp 98.1 F (36.7 C) (Oral)   Resp 16    SpO2 97%  Physical Exam Vitals and nursing note reviewed.  Constitutional:      Appearance: He is well-developed.  HENT:     Head: Normocephalic and atraumatic.  Eyes:     Conjunctiva/sclera: Conjunctivae normal.     Pupils: Pupils are equal, round, and reactive to light.  Cardiovascular:     Rate and Rhythm: Normal rate and regular rhythm.     Heart sounds: Normal heart sounds.  Pulmonary:     Effort: Pulmonary effort is normal.     Breath sounds: Normal breath sounds.  Abdominal:     General: Bowel sounds are normal.     Palpations: Abdomen is soft.  Musculoskeletal:        General: Normal range of motion.     Cervical back: Normal range of motion.     Comments: Very mild tenderness along left heel, no open wounds/sores, no bony deformity, no overlying skin changes  Skin:    General: Skin is warm and dry.  Neurological:     Mental Status: He is alert and oriented to person, place, and time.     ED Results / Procedures / Treatments   Labs (all labs ordered are listed, but only abnormal results are displayed) Labs Reviewed - No data  to display  EKG None  Radiology No results found.  Procedures Procedures    Medications Ordered in ED Medications - No data to display  ED Course/ Medical Decision Making/ A&P                                 Medical Decision Making  57 year old male presenting to the ED with left foot pain.  Mostly along the sole.  Reports history of plantar fasciitis and feels like it is the same.  Mildly tender, mostly along the heel.  No deformity.  No open wounds or sores.  Do not feel imaging will ultimately change management.  He requested ASO which he had on previously and helped quite a bit, this was applied.  Encouraged to continue home PT exercises and given handout for such.  Can return here for new concerns.  Final Clinical Impression(s) / ED Diagnoses Final diagnoses:  Foot pain, left    Rx / DC Orders ED Discharge Orders      None         Garlon Hatchet, PA-C 10/06/23 0357    Sabas Sous, MD 10/06/23 [phone]

## 2023-10-06 NOTE — Discharge Instructions (Signed)
Continue trying to stretch out your foot-- can try using tennis ball or frozen water bottle beneath foot for added stretching. Follow-up with your doctor. Return here for new concerns.

## 2023-12-03 ENCOUNTER — Emergency Department (HOSPITAL_COMMUNITY)
Admission: EM | Admit: 2023-12-03 | Discharge: 2023-12-03 | Disposition: A | Discharge status: Home / Self Care | Payer: No Typology Code available for payment source | Attending: Emergency Medicine | Admitting: Emergency Medicine

## 2023-12-03 ENCOUNTER — Other Ambulatory Visit: Payer: Self-pay

## 2023-12-03 ENCOUNTER — Encounter (HOSPITAL_COMMUNITY): Payer: Self-pay

## 2023-12-03 DIAGNOSIS — I10 Essential (primary) hypertension: Secondary | ICD-10-CM | POA: Diagnosis not present

## 2023-12-03 DIAGNOSIS — M25562 Pain in left knee: Secondary | ICD-10-CM | POA: Diagnosis present

## 2023-12-03 DIAGNOSIS — G8929 Other chronic pain: Secondary | ICD-10-CM | POA: Insufficient documentation

## 2023-12-03 NOTE — ED Provider Notes (Signed)
[place] EMERGENCY DEPARTMENT AT [facility] Provider Note   CSN: [id number] Arrival date & time: 12/03/23  2125     History  Chief Complaint  Patient presents with   Knee Pain    Charles Patterson is a 58 y.o. male with PMHx HTN who presents to ED concerned for left knee pain x2 weeks. Patient stating that his knee starts hurting when it gets cold. 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 Denies recent trauma. States that his pain was managed with a knee brace in the past, but his knee pain got better so he no longer has his knee brace. Requesting new knee brace today. Patient stating that he is not interested in further imaging/workup today.   Knee Pain      Home Medications Prior to Admission medications   Medication Sig Start Date End Date Taking? Authorizing Provider  acetaminophen (TYLENOL) 500 MG tablet Take 500 mg by mouth every 6 (six) hours as needed for mild pain.    [provider]  benzonatate (TESSALON) 100 MG capsule Take 1 capsule (100 mg total) by mouth 3 (three) times daily as needed for cough. 07/20/23   Long, Arlyss Repress, MD  fluticasone (FLONASE) 50 MCG/ACT nasal spray Place 2 sprays into both nostrils daily for 7 days. 07/20/23 07/27/23  Long, Arlyss Repress, MD  guaiFENesin (MUCINEX) 600 MG 12 hr tablet Take 1 tablet (600 mg total) by mouth 2 (two) times daily. 12/17/22   Palumbo, April, MD  ibuprofen (ADVIL) 600 MG tablet Take 1 tablet (600 mg total) by mouth every 6 (six) hours as needed. 11/24/22   Mesner, Barbara Cower, MD  methocarbamol (ROBAXIN) 500 MG tablet Take 1 tablet (500 mg total) by mouth 2 (two) times daily. 11/24/22   Mesner, Barbara Cower, MD      Allergies    Patient has no known allergies.    Review of Systems   Review of Systems  Musculoskeletal:        Knee pain    Physical Exam Updated Vital Signs BP (!) 143/89 (BP Location: Right Arm)   Pulse 83   Temp 98.2 F (36.8 C)   Resp 16   Ht 5\' 7"  (1.702 m)    Wt 77.1 kg   SpO2 97%   BMI 26.63 kg/m  Physical Exam Vitals and nursing note reviewed.  Constitutional:      General: He is not in acute distress.    Appearance: He is not ill-appearing or toxic-appearing.  HENT:     Head: Normocephalic and atraumatic.  Eyes:     General: No scleral icterus.       Right eye: No discharge.        Left eye: No discharge.     Conjunctiva/sclera: Conjunctivae normal.  Cardiovascular:     Rate and Rhythm: Normal rate.  Pulmonary:     Effort: Pulmonary effort is normal.  Abdominal:     General: Abdomen is flat.  Musculoskeletal:     Comments: Normal ROM of right knee. +2 pedal pulse. Sensation to light touch intact. Area non-tense. No swelling, erythema, or increased warmth.   Skin:    General: Skin is warm and dry.  Neurological:     General: No focal deficit present.     Mental Status: He is alert. Mental status is at baseline.  Psychiatric:        Mood and Affect: Mood normal.        Behavior:  Behavior normal.     ED Results / Procedures / Treatments   Labs (all labs ordered are listed, but only abnormal results are displayed) Labs Reviewed - No data to display  EKG None  Radiology No results found.  Procedures Procedures    Medications Ordered in ED Medications - No data to display  ED Course/ Medical Decision Making/ A&P                                 Medical Decision Making  This patient presents to the ED for concern of left knee pain, this involves an extensive number of treatment options, and is a complaint that carries with it a high risk of complications and morbidity.  The differential diagnosis includes hemarthrosis, gout, septic joint, fracture, tendonitis,  muscle strain, bursitis, compartment syndrome   Co morbidities that complicate the patient evaluation  none   Additional history obtained:  PCP with [facility]   Problem List / ED Course / Critical interventions / Medication  management  Patient presents to ED concerned for left knee pain. Patient with hx of chronic knee pain. No recent trauma. Patient stating that the pain happens when weather gets cold. Patient declining further workup today. Patient only wants to knee brace. Patient afebrile with stable vitals. Physical exam reassuring. Provided patient with knee brace. Offered patient pain management which he refused. Recommended following up with PCP. Patient verbalized understanding of plan. I have reviewed the patients home medicines and have made adjustments as needed Patient afebrile with stable vitals. Provided with return precautions. Discharged in good condition.   Ddx these are considered less likely due to history of present illness and physical exam -hemarthrosis: joint without swelling; ROM intact -gout: no warmth or erythema; ROM intact  -septic joint: afebrile; no warmth or erythema; no skin changes; ROM intact  -fracture: no recent trauma -compartment syndrome: area not tense; neurovascularly intact   Social Determinants of Health:  none         Final Clinical Impression(s) / ED Diagnoses Final diagnoses:  Chronic pain of left knee    Rx / DC Orders ED Discharge Orders     None         Dorthy Cooler, [place] 12/03/23 2157    Elayne Snare K, DO 12/03/23 2324

## 2023-12-03 NOTE — Discharge Instructions (Addendum)

## 2023-12-03 NOTE — ED Triage Notes (Signed)
Patient c/o knee pain x 2 weeks that he thought was b/c of cold weather. Patient reports he had knee brace but did not want to become dependent on it.

## 2024-03-25 ENCOUNTER — Other Ambulatory Visit: Payer: Self-pay

## 2024-03-25 ENCOUNTER — Emergency Department (HOSPITAL_COMMUNITY)
Admission: EM | Admit: 2024-03-25 | Discharge: 2024-03-25 | Discharge status: Against Medical Advice | Attending: Emergency Medicine | Admitting: Emergency Medicine

## 2024-03-25 ENCOUNTER — Encounter (HOSPITAL_COMMUNITY): Payer: Self-pay | Admitting: Emergency Medicine

## 2024-03-25 DIAGNOSIS — M25562 Pain in left knee: Secondary | ICD-10-CM | POA: Insufficient documentation

## 2024-03-25 DIAGNOSIS — Z5321 Procedure and treatment not carried out due to patient leaving prior to being seen by health care provider: Secondary | ICD-10-CM | POA: Insufficient documentation

## 2024-03-25 NOTE — ED Triage Notes (Signed)
Pt c/o left knee pain x 1 week ?

## 2024-03-25 NOTE — ED Notes (Signed)
Pt called 3x no answer  

## 2024-04-07 ENCOUNTER — Emergency Department (HOSPITAL_COMMUNITY)
Admission: EM | Admit: 2024-04-07 | Discharge: 2024-04-07 | Discharge status: Against Medical Advice | Attending: Emergency Medicine | Admitting: Emergency Medicine

## 2024-04-07 ENCOUNTER — Encounter (HOSPITAL_COMMUNITY): Payer: Self-pay | Admitting: *Deleted

## 2024-04-07 ENCOUNTER — Other Ambulatory Visit: Payer: Self-pay

## 2024-04-07 DIAGNOSIS — Z5321 Procedure and treatment not carried out due to patient leaving prior to being seen by health care provider: Secondary | ICD-10-CM | POA: Insufficient documentation

## 2024-04-07 DIAGNOSIS — R059 Cough, unspecified: Secondary | ICD-10-CM | POA: Insufficient documentation

## 2024-04-07 NOTE — ED Notes (Signed)
No answer X 3.

## 2024-04-07 NOTE — ED Triage Notes (Signed)
 The p treports that he has had a cold for one week  he keeps sleeping while I try to check him in

## 2024-04-08 ENCOUNTER — Other Ambulatory Visit: Payer: Self-pay

## 2024-04-08 ENCOUNTER — Emergency Department (HOSPITAL_COMMUNITY)

## 2024-04-08 ENCOUNTER — Emergency Department (HOSPITAL_COMMUNITY)
Admission: EM | Admit: 2024-04-08 | Discharge: 2024-04-08 | Disposition: A | Discharge status: Home / Self Care | Attending: Emergency Medicine | Admitting: Emergency Medicine

## 2024-04-08 ENCOUNTER — Encounter (HOSPITAL_COMMUNITY): Payer: Self-pay

## 2024-04-08 DIAGNOSIS — R059 Cough, unspecified: Secondary | ICD-10-CM

## 2024-04-08 DIAGNOSIS — R058 Other specified cough: Secondary | ICD-10-CM | POA: Insufficient documentation

## 2024-04-08 DIAGNOSIS — F17200 Nicotine dependence, unspecified, uncomplicated: Secondary | ICD-10-CM | POA: Diagnosis not present

## 2024-04-08 LAB — RESP PANEL BY RT-PCR (RSV, FLU A&B, COVID)  RVPGX2
Influenza A by PCR: NEGATIVE
Influenza B by PCR: NEGATIVE
Resp Syncytial Virus by PCR: NEGATIVE
SARS Coronavirus 2 by RT PCR: NEGATIVE

## 2024-04-08 MED ORDER — BENZONATATE 100 MG PO CAPS: 100.0000 mg | ORAL_CAPSULE | Freq: Two times a day (BID) | ORAL | 0 refills | Status: AC | PRN

## 2024-04-08 MED ORDER — AZITHROMYCIN 250 MG PO TABS: 250.0000 mg | ORAL_TABLET | Freq: Every day | ORAL | 0 refills | Status: AC

## 2024-04-08 NOTE — ED Notes (Signed)
 Patient returned from xray.

## 2024-04-08 NOTE — ED Provider Notes (Signed)
 [facility] Emergency Department Provider Note MRN:  [id number]  Arrival date & time: 04/08/24     Chief Complaint   Cough   History of Present Illness   Charles Patterson is a 58 y.o. year-old male presents to the ED with chief complaint of cough.  States has had a productive cough for approximately 1 week.  Denies fevers or chills.  Denies any successful treatments prior to arrival.  Denies any wheezing.  Denies any other associated symptoms..  History provided by patient.   Review of Systems  Pertinent positive and negative review of systems noted in HPI.    Physical Exam   Vitals:   04/08/24 0331  BP: (!) 157/89  Pulse: 93  Resp: 18  Temp: (!) 97.4 F (36.3 C)  SpO2: 97%    CONSTITUTIONAL:  non toxic-appearing, NAD NEURO:  Alert and oriented x 3, CN 3-12 grossly intact EYES:  eyes equal and reactive ENT/NECK:  Supple, no stridor  CARDIO:  normal rate, regular rhythm, appears well-perfused  PULM:  No respiratory distress, dry cough, no wheezing GI/GU:  non-distended,  MSK/SPINE:  No gross deformities, no edema, moves all extremities  SKIN:  no rash, atraumatic   *Additional and/or pertinent findings included in MDM below  Diagnostic and Interventional Summary    EKG Interpretation Date/Time:    Ventricular Rate:    PR Interval:    QRS Duration:    QT Interval:    QTC Calculation:   R Axis:      Text Interpretation:         Labs Reviewed  RESP PANEL BY RT-PCR (RSV, FLU A&B, COVID)  [id number]    DG Chest 2 View  Final Result      Medications - No data to display   Procedures  /  Critical Care Procedures  ED Course and Medical Decision Making  I have reviewed the triage vital signs, the nursing notes, and pertinent available records from the EMR.  Social Determinants Affecting Complexity of Care: Patient has no clinically significant social determinants affecting this chief complaint..   ED Course:    Medical  Decision Making Patient here with cough for the past week.  Denies fevers.  He states that it has been productive.  He is a smoker.  COVID and flu are negative.  Chest x-ray ordered and does not demonstrate pneumonia.  However, given his smoking history and no changes with his cough, will trial a Z-Pak and Tessalon  Perles.  Amount and/or Complexity of Data Reviewed Radiology: ordered.  Risk Prescription drug management.         Consultants: No consultations were needed in caring for this patient.   Treatment and Plan: Emergency department workup does not suggest an emergent condition requiring admission or immediate intervention beyond  what has been performed at this time. The patient is safe for discharge and has  been instructed to return immediately for worsening symptoms, change in  symptoms or any other concerns    Final Clinical Impressions(s) / ED Diagnoses     ICD-10-CM   1. Cough, unspecified type  R05.9       ED Discharge Orders          Ordered    azithromycin (ZITHROMAX) 250 MG tablet  Daily        04/08/24 0546    benzonatate  (TESSALON ) 100 MG capsule  2 times daily PRN        04/08/24 0546  Discharge Instructions Discussed with and Provided to Patient:   Discharge Instructions   None      Sherel Dikes, PA-C 04/08/24 1610    Edson Graces, MD 04/08/24 0730

## 2024-04-08 NOTE — ED Notes (Signed)
 Attempted to review discharge instructions with pt. Pt no found. No belongings at bedside. Pt not in bathroom.

## 2024-04-08 NOTE — ED Triage Notes (Signed)
 Productive cough with yellow sputum, headache, and generalized not feeling well for a week.

## 2024-05-24 ENCOUNTER — Emergency Department (HOSPITAL_COMMUNITY)
Admission: EM | Admit: 2024-05-24 | Discharge: 2024-05-25 | Disposition: A | Discharge status: Home / Self Care | Attending: Emergency Medicine | Admitting: Emergency Medicine

## 2024-05-24 ENCOUNTER — Encounter (HOSPITAL_COMMUNITY): Payer: Self-pay | Admitting: Emergency Medicine

## 2024-05-24 ENCOUNTER — Emergency Department (HOSPITAL_COMMUNITY)

## 2024-05-24 DIAGNOSIS — M25512 Pain in left shoulder: Secondary | ICD-10-CM | POA: Insufficient documentation

## 2024-05-24 DIAGNOSIS — X509XXA Other and unspecified overexertion or strenuous movements or postures, initial encounter: Secondary | ICD-10-CM | POA: Diagnosis not present

## 2024-05-24 DIAGNOSIS — Y93B2 Activity, push-ups, pull-ups, sit-ups: Secondary | ICD-10-CM | POA: Diagnosis not present

## 2024-05-24 NOTE — ED Triage Notes (Signed)
 Pt arrives POV c/o left shoulder pain that started at 1930 when he was doing pull ups. Pt able to move all extremities. Denies other pain at this time.

## 2024-05-24 NOTE — ED Triage Notes (Signed)
 Left shoulder pain- states heard a pop. Full ROM. Pulses intact.

## 2024-05-25 MED ORDER — DICLOFENAC SODIUM 1 % EX GEL: 2.0000 g | Freq: Four times a day (QID) | CUTANEOUS | 0 refills | Status: AC

## 2024-05-25 MED ORDER — METHOCARBAMOL 500 MG PO TABS: 500.0000 mg | ORAL_TABLET | Freq: Two times a day (BID) | ORAL | 0 refills | Status: AC

## 2024-05-25 NOTE — Discharge Instructions (Signed)
Take the prescribed medication as directed. Follow-up with your doctor if ongoing issues.  Return to the ED for new or worsening symptoms.

## 2024-05-25 NOTE — ED Provider Notes (Signed)
 [place] EMERGENCY DEPARTMENT AT [facility] Provider Note   CSN: [id number] Arrival date & time: 05/24/24  2214     Patient presents with: Shoulder Pain   Charles Patterson is a 58 y.o. male.   The history is provided by the patient and medical records.  Shoulder Pain  58 year old male presenting to the ED with left shoulder pain.  States Charles Patterson felt a pop while Charles Patterson was doing some pull-ups at the gym.  States it feels somewhat better now as Charles Patterson used some IcyHot but still has some soreness along the posterior aspect.  Charles Patterson denies any numbness or weakness of the left arm.  Prior to Admission medications   Medication Sig Start Date End Date Taking? Authorizing Provider  diclofenac  Sodium (VOLTAREN ) 1 % GEL Apply 2 g topically 4 (four) times daily. 05/25/24  Yes Jarold Olam HERO, PA-C  methocarbamol  (ROBAXIN ) 500 MG tablet Take 1 tablet (500 mg total) by mouth 2 (two) times daily. 05/25/24  Yes Jarold Olam HERO, PA-C  acetaminophen  (TYLENOL ) 500 MG tablet Take 500 mg by mouth every 6 (six) hours as needed for mild pain.    [provider]  azithromycin  (ZITHROMAX ) 250 MG tablet Take 1 tablet (250 mg total) by mouth daily. Take first 2 tablets together, then 1 every day until finished. 04/08/24   Vicky Charleston, PA-C  benzonatate  (TESSALON ) 100 MG capsule Take 1 capsule (100 mg total) by mouth 2 (two) times daily as needed for cough. 04/08/24   Vicky Charleston, PA-C  fluticasone  (FLONASE ) 50 MCG/ACT nasal spray Place 2 sprays into both nostrils daily for 7 days. 07/20/23 07/27/23  Long, Fonda MATSU, MD  guaiFENesin  (MUCINEX ) 600 MG 12 hr tablet Take 1 tablet (600 mg total) by mouth 2 (two) times daily. 12/17/22   Palumbo, April, MD  ibuprofen  (ADVIL ) 600 MG tablet Take 1 tablet (600 mg total) by mouth every 6 (six) hours as needed. 11/24/22   Mesner, Selinda, MD    Allergies: Patient has no known allergies.    Review of Systems  Musculoskeletal:  Positive for arthralgias.  All other  systems reviewed and are negative.   Updated Vital Signs BP 113/89 (BP Location: Right Arm)   Pulse 80   Temp 98 F (36.7 C) (Oral)   Resp 20   SpO2 100%   Physical Exam Vitals and nursing note reviewed.  Constitutional:      Appearance: Charles Patterson is well-developed.  HENT:     Head: Normocephalic and atraumatic.  Eyes:     Conjunctiva/sclera: Conjunctivae normal.     Pupils: Pupils are equal, round, and reactive to light.  Cardiovascular:     Rate and Rhythm: Normal rate and regular rhythm.     Heart sounds: Normal heart sounds.  Pulmonary:     Effort: Pulmonary effort is normal.     Breath sounds: Normal breath sounds.  Musculoskeletal:        General: Normal range of motion.     Cervical back: Normal range of motion.     Comments: Left shoulder is normal in appearance, no signs of dislocation, has some tenderness of the left inferior scapular area but there is no bony deformity, normal range of motion, normal grip strength  Skin:    General: Skin is warm and dry.  Neurological:     Mental Status: Charles Patterson is alert and oriented to person, place, and time.     (all labs ordered are listed, but only abnormal results are displayed) Labs  Reviewed - No data to display  EKG: None  Radiology: DG Shoulder Left Result Date: 05/24/2024 CLINICAL DATA:  Pain. EXAM: LEFT SHOULDER - 2+ VIEW COMPARISON:  None Available. FINDINGS: There is no evidence of fracture or dislocation. Acromioclavicular degenerative change with narrowing and osteophyte formation. IMPRESSION: Acromioclavicular degenerative changes. No acute osseous abnormalities. Electronically Signed   By: Fonda Field M.D.   On: 05/24/2024 23:08     Procedures   Medications Ordered in the ED - No data to display                                  Medical Decision Making Amount and/or Complexity of Data Reviewed Radiology: ordered and independent interpretation performed.  Risk Prescription drug  management.   58 year old male here with left shoulder pain.  Felt a pop while doing pull-ups at the gym.  Pain along the inferior scapular area.  No acute deformities on exam.  Has normal range of motion of the left shoulder.  Arm is neurovascularly intact.  X-ray is negative for any acute bony findings.  May be a muscular strain.  Will treat symptomatically and have him follow-up closely with PCP.  Can return here for any new or acute changes.  Final diagnoses:  Acute pain of left shoulder    ED Discharge Orders          Ordered    methocarbamol  (ROBAXIN ) 500 MG tablet  2 times daily        05/25/24 0140    diclofenac  Sodium (VOLTAREN ) 1 % GEL  4 times daily        05/25/24 0140               Jarold Olam HERO, PA-C 05/25/24 0142    Theadore Ozell HERO, MD 05/25/24 [phone]

## 2024-05-29 ENCOUNTER — Emergency Department (HOSPITAL_COMMUNITY)
Admission: EM | Admit: 2024-05-29 | Discharge: 2024-05-30 | Disposition: A | Discharge status: Home / Self Care | Attending: Emergency Medicine | Admitting: Emergency Medicine

## 2024-05-29 ENCOUNTER — Emergency Department (HOSPITAL_COMMUNITY)

## 2024-05-29 ENCOUNTER — Other Ambulatory Visit: Payer: Self-pay

## 2024-05-29 DIAGNOSIS — E876 Hypokalemia: Secondary | ICD-10-CM | POA: Insufficient documentation

## 2024-05-29 DIAGNOSIS — F10920 Alcohol use, unspecified with intoxication, uncomplicated: Secondary | ICD-10-CM | POA: Diagnosis present

## 2024-05-29 DIAGNOSIS — Y906 Blood alcohol level of 120-199 mg/100 ml: Secondary | ICD-10-CM | POA: Diagnosis not present

## 2024-05-29 LAB — CBC WITH DIFFERENTIAL/PLATELET
Abs Immature Granulocytes: 0.02 K/uL (ref 0.00–0.07)
Basophils Absolute: 0 K/uL (ref 0.0–0.1)
Basophils Relative: 0 %
Eosinophils Absolute: 0.2 K/uL (ref 0.0–0.5)
Eosinophils Relative: 2 %
HCT: 40.3 % (ref 39.0–52.0)
Hemoglobin: 13.1 g/dL (ref 13.0–17.0)
Immature Granulocytes: 0 %
Lymphocytes Relative: 48 %
Lymphs Abs: 3.5 K/uL (ref 0.7–4.0)
MCH: 31.1 pg (ref 26.0–34.0)
MCHC: 32.5 g/dL (ref 30.0–36.0)
MCV: 95.7 fL (ref 80.0–100.0)
Monocytes Absolute: 0.5 K/uL (ref 0.1–1.0)
Monocytes Relative: 7 %
Neutro Abs: 3.3 K/uL (ref 1.7–7.7)
Neutrophils Relative %: 43 %
Platelets: 304 K/uL (ref 150–400)
RBC: 4.21 MIL/uL — ABNORMAL LOW (ref 4.22–5.81)
RDW: 13.3 % (ref 11.5–15.5)
WBC: 7.5 K/uL (ref 4.0–10.5)
nRBC: 0 % (ref 0.0–0.2)

## 2024-05-29 LAB — ETHANOL: Alcohol, Ethyl (B): 189 mg/dL — ABNORMAL HIGH (ref <15)

## 2024-05-29 LAB — COMPREHENSIVE METABOLIC PANEL WITH GFR
ALT: 17 U/L (ref 0–44)
AST: 23 U/L (ref 15–41)
Albumin: 3.7 g/dL (ref 3.5–5.0)
Alkaline Phosphatase: 55 U/L (ref 38–126)
Anion gap: 13 (ref 5–15)
BUN: 8 mg/dL (ref 6–20)
CO2: 23 mmol/L (ref 22–32)
Calcium: 8.6 mg/dL — ABNORMAL LOW (ref 8.9–10.3)
Chloride: 104 mmol/L (ref 98–111)
Creatinine, Ser: 0.88 mg/dL (ref 0.61–1.24)
GFR, Estimated: >60 mL/min (ref >60)
Glucose, Bld: 102 mg/dL — ABNORMAL HIGH (ref 70–99)
Potassium: 3.1 mmol/L — ABNORMAL LOW (ref 3.5–5.1)
Sodium: 140 mmol/L (ref 135–145)
Total Bilirubin: 0.7 mg/dL (ref 0.0–1.2)
Total Protein: 7.1 g/dL (ref 6.5–8.1)

## 2024-05-29 MED ORDER — SODIUM CHLORIDE 0.9 % IV BOLUS
1000.0000 mL | Freq: Once | INTRAVENOUS | Status: AC
  Administered 2024-05-29: 1000 mL via INTRAVENOUS

## 2024-05-29 MED ORDER — ONDANSETRON HCL 4 MG/2ML IJ SOLN
4.0000 mg | Freq: Once | INTRAMUSCULAR | Status: AC
  Administered 2024-05-29: 4 mg via INTRAVENOUS
  Filled 2024-05-29: qty 2

## 2024-05-29 NOTE — ED Triage Notes (Signed)
 [facility]  Unconscious.  ETOH.  Pt actively vomiting.  Pt reports drank too much  Mainly on responds to painful stimuli.

## 2024-05-29 NOTE — ED Provider Notes (Signed)
  EMERGENCY DEPARTMENT AT [facility] Provider Note   CSN: [id number] Arrival date & time: 05/29/24  1910     Patient presents with: Alcohol Intoxication   Charles Patterson is a 58 y.o. male.  Patient with past history significant for hypertension presents emergency department with reported alcohol intoxication.  Patient was brought in by police after being found outside of jail.  He was reportedly unconscious.  Patient has had some vomiting and reported that he drank too much.  He is responsive but primarily to painful stimuli.  Denies any other substance use.   Alcohol Intoxication       Prior to Admission medications   Medication Sig Start Date End Date Taking? Authorizing Provider  acetaminophen  (TYLENOL ) 500 MG tablet Take 500 mg by mouth every 6 (six) hours as needed for mild pain.    [provider]  azithromycin  (ZITHROMAX ) 250 MG tablet Take 1 tablet (250 mg total) by mouth daily. Take first 2 tablets together, then 1 every day until finished. 04/08/24   Vicky Charleston, PA-C  benzonatate  (TESSALON ) 100 MG capsule Take 1 capsule (100 mg total) by mouth 2 (two) times daily as needed for cough. 04/08/24   Vicky Charleston, PA-C  diclofenac  Sodium (VOLTAREN ) 1 % GEL Apply 2 g topically 4 (four) times daily. 05/25/24   Jarold Olam HERO, PA-C  fluticasone  (FLONASE ) 50 MCG/ACT nasal spray Place 2 sprays into both nostrils daily for 7 days. 07/20/23 07/27/23  Long, Fonda MATSU, MD  guaiFENesin  (MUCINEX ) 600 MG 12 hr tablet Take 1 tablet (600 mg total) by mouth 2 (two) times daily. 12/17/22   Palumbo, April, MD  ibuprofen  (ADVIL ) 600 MG tablet Take 1 tablet (600 mg total) by mouth every 6 (six) hours as needed. 11/24/22   Mesner, Selinda, MD  methocarbamol  (ROBAXIN ) 500 MG tablet Take 1 tablet (500 mg total) by mouth 2 (two) times daily. 05/25/24   Jarold Olam HERO, PA-C    Allergies: Patient has no known allergies.    Review of Systems  Psychiatric/Behavioral:          Intoxication  All other systems reviewed and are negative.   Updated Vital Signs BP (!) 159/105 (BP Location: Right Arm)   Pulse 94   Resp 16   SpO2 96%   Physical Exam Vitals and nursing note reviewed.  Constitutional:      General: He is not in acute distress.    Appearance: He is well-developed.  HENT:     Head: Normocephalic and atraumatic.  Eyes:     Conjunctiva/sclera: Conjunctivae normal.  Cardiovascular:     Rate and Rhythm: Normal rate and regular rhythm.     Heart sounds: No murmur heard. Pulmonary:     Effort: Pulmonary effort is normal. No respiratory distress.     Breath sounds: Normal breath sounds.  Abdominal:     Palpations: Abdomen is soft.     Tenderness: There is no abdominal tenderness.  Musculoskeletal:        General: No swelling.     Cervical back: Neck supple.  Skin:    General: Skin is warm and dry.     Capillary Refill: Capillary refill takes less than 2 seconds.  Neurological:     Mental Status: He is alert.     Cranial Nerves: No cranial nerve deficit.     Motor: No weakness.  Psychiatric:        Mood and Affect: Mood normal.     (all labs  ordered are listed, but only abnormal results are displayed) Labs Reviewed  CBC WITH DIFFERENTIAL/PLATELET - Abnormal; Notable for the following components:      Result Value   RBC 4.21 (*)    All other components within normal limits  COMPREHENSIVE METABOLIC PANEL WITH GFR - Abnormal; Notable for the following components:   Potassium 3.1 (*)    Glucose, Bld 102 (*)    Calcium 8.6 (*)    All other components within normal limits  ETHANOL - Abnormal; Notable for the following components:   Alcohol, Ethyl (B) 189 (*)    All other components within normal limits  URINALYSIS, ROUTINE W REFLEX MICROSCOPIC  RAPID URINE DRUG SCREEN, HOSP PERFORMED    EKG: None  Radiology: DG Chest Portable 1 View Result Date: 05/29/2024 CLINICAL DATA:  Intoxication, vomiting EXAM: PORTABLE CHEST 1 VIEW  COMPARISON:  04/08/2024 FINDINGS: The heart size and mediastinal contours are within normal limits. Both lungs are clear. The visualized skeletal structures are unremarkable. IMPRESSION: No active disease. Electronically Signed   By: Dorethia Molt M.D.   On: 05/29/2024 20:31     Procedures   Medications Ordered in the ED  ondansetron  (ZOFRAN ) injection 4 mg (4 mg Intravenous Given 05/29/24 1934)  sodium chloride  0.9 % bolus 1,000 mL (0 mLs Intravenous Stopped 05/29/24 2103)                                    Medical Decision Making Amount and/or Complexity of Data Reviewed Labs: ordered. Radiology: ordered.  Risk Prescription drug management.   This patient presents to the ED for concern of alcohol intoxication, this involves an extensive number of treatment options, and is a complaint that carries with it a high risk of complications and morbidity.  The differential diagnosis includes intoxication, substance use disorder, polysubstance abuse, dehydration   Co morbidities that complicate the patient evaluation  Hypertension   Lab Tests:  I Ordered, and personally interpreted labs.  The pertinent results include: CBC unremarkable, CMP with mild hypokalemia 3.1, ethanol elevated at 189, UA and UDS pending   Imaging Studies ordered:  I ordered imaging studies including chest xray  I independently visualized and interpreted imaging which showed negative for any acute findings I agree with the radiologist interpretation   Cardiac Monitoring: / EKG:  The patient was maintained on a cardiac monitor.  I personally viewed and interpreted the cardiac monitored which showed an underlying rhythm of: Sinus rhythm   Consultations Obtained:  I requested consultation with none,  and discussed lab and imaging findings as well as pertinent plan - they recommend: N/A   Problem List / ED Course / Critical interventions / Medication management  Patient with past history significant  for hypertension presents to the emergency department with concerns of alcohol intoxication.  Reportedly was found outside of the jail today and reported that he had drank too much.  Denies any other substance use.  He was found to be vomiting on arrival.  No other reported symptoms at this time.  On exam, patient is notably intoxicated.  He can converse with me but is at times difficult to arouse.  Was found to have urinated on the floor at 1 point.  No obvious visual injury or trauma.  There is some vomit on his shirt.  Based on his presentation, obtain basic labs including ethanol for evaluation of alcohol level.  Chest x-ray added on  given patient's vomiting to assess for any signs of aspiration. EKG, chest x-ray, basic labs unremarkable.  Ethanol elevated at 189. On reevaluation, patient somewhat more alert.  Will still require more time to metabolize as he did appear to have urinated on the floor and his confusion. I ordered medication including fluids, Zofran  for dehydration, nausea Reevaluation of the patient after these medicines showed that the patient improved I have reviewed the patients home medicines and have made adjustments as needed   12:06 AM Care of Helayne Essex transferred to [facility] and Dr. Griselda at the end of my shift as the patient will require reassessment once labs/imaging have resulted. Patient presentation, ED course, and plan of care discussed with review of all pertinent labs and imaging. Please see his/her note for further details regarding further ED course and disposition. Plan at time of handoff is MTF. Can discharge once more alert and aware of surroundings and can safely ambulate independently. This may be altered or completely changed at the discretion of the oncoming team pending results of further workup.   Test / Admission - Considered:  Final disposition per oncoming team.  Final diagnoses:  Alcoholic intoxication without complication [facility])   Hypokalemia    ED Discharge Orders     None          Cecily Legrand LABOR, PA-C 05/30/24 0006    Geraldene Hamilton, MD 06/08/24 1034

## 2024-05-29 NOTE — ED Notes (Signed)
 Pt was assisted from the bed to the bathroom but was not following instructions. ED staff was unable to collect a urine sample at this time.

## 2024-05-30 NOTE — ED Provider Notes (Signed)
  Physical Exam  BP 125/74 (BP Location: Right Arm)   Pulse 78   Temp 98.4 F (36.9 C) (Axillary)   Resp 18   SpO2 98%   Physical Exam  Procedures  Procedures  ED Course / MDM    Medical Decision Making Amount and/or Complexity of Data Reviewed Labs: ordered. Radiology: ordered.  Risk Prescription drug management.   Patient care center shift handoff.  Patient currently in the emergency department due to alcohol intoxication.  Plan to allow patient to metabolize until able to ambulate and tolerate oral intake.  Paramedic notified me that patient was ambulatory and asking to go home.  I requested to see if the patient can pass a p.o. challenge.  Before the paramedic was able to complete the p.o. challenge the patient stated he needed to go to church this morning and eloped from the department.       Logan Ubaldo KATHEE DEVONNA 05/30/24 0315    Griselda Norris, MD 05/30/24 [phone]

## 2024-05-30 NOTE — ED Notes (Signed)
 Pt awake, ambulatory to bathroom on his own

## 2024-05-31 NOTE — ED Notes (Addendum)
 Pt arrived to ED lobby (05/31/24) requesting this writer to obtain his personal items from recent visit. Checked notes to see documentation of his personal items. Items were returned by security upfront in the lobby.

## 2024-06-07 LAB — AMB RESULTS CONSOLE CBG: Glucose: 107

## 2024-06-07 NOTE — Progress Notes (Signed)
 Food sdoh, community resources given

## 2024-06-16 ENCOUNTER — Emergency Department (HOSPITAL_COMMUNITY)
Admission: EM | Admit: 2024-06-16 | Discharge: 2024-06-17 | Discharge status: Against Medical Advice | Source: Home / Self Care

## 2024-06-16 ENCOUNTER — Encounter (HOSPITAL_COMMUNITY): Payer: Self-pay | Admitting: *Deleted

## 2024-06-16 ENCOUNTER — Other Ambulatory Visit: Payer: Self-pay

## 2024-06-16 ENCOUNTER — Emergency Department (HOSPITAL_COMMUNITY): Admission: EM | Admit: 2024-06-16 | Discharge: 2024-06-16 | Disposition: A | Discharge status: Home / Self Care

## 2024-06-16 DIAGNOSIS — R82998 Other abnormal findings in urine: Secondary | ICD-10-CM | POA: Diagnosis not present

## 2024-06-16 DIAGNOSIS — Z5321 Procedure and treatment not carried out due to patient leaving prior to being seen by health care provider: Secondary | ICD-10-CM | POA: Insufficient documentation

## 2024-06-16 DIAGNOSIS — M79604 Pain in right leg: Secondary | ICD-10-CM | POA: Diagnosis present

## 2024-06-16 DIAGNOSIS — Z79899 Other long term (current) drug therapy: Secondary | ICD-10-CM | POA: Insufficient documentation

## 2024-06-16 DIAGNOSIS — M791 Myalgia, unspecified site: Secondary | ICD-10-CM | POA: Insufficient documentation

## 2024-06-16 DIAGNOSIS — R252 Cramp and spasm: Secondary | ICD-10-CM | POA: Insufficient documentation

## 2024-06-16 DIAGNOSIS — I10 Essential (primary) hypertension: Secondary | ICD-10-CM | POA: Diagnosis not present

## 2024-06-16 LAB — URINALYSIS, ROUTINE W REFLEX MICROSCOPIC
Bacteria, UA: NONE SEEN
Bilirubin Urine: NEGATIVE
Glucose, UA: NEGATIVE mg/dL
Ketones, ur: NEGATIVE mg/dL
Leukocytes,Ua: NEGATIVE
Nitrite: NEGATIVE
Protein, ur: NEGATIVE mg/dL
Specific Gravity, Urine: 1.029 (ref 1.005–1.030)
pH: 5 (ref 5.0–8.0)

## 2024-06-16 LAB — CBC WITH DIFFERENTIAL/PLATELET
Abs Immature Granulocytes: 0.02 K/uL (ref 0.00–0.07)
Basophils Absolute: 0 K/uL (ref 0.0–0.1)
Basophils Relative: 0 %
Eosinophils Absolute: 0.1 K/uL (ref 0.0–0.5)
Eosinophils Relative: 2 %
HCT: 45.4 % (ref 39.0–52.0)
Hemoglobin: 14.9 g/dL (ref 13.0–17.0)
Immature Granulocytes: 0 %
Lymphocytes Relative: 47 %
Lymphs Abs: 2.5 K/uL (ref 0.7–4.0)
MCH: 32 pg (ref 26.0–34.0)
MCHC: 32.8 g/dL (ref 30.0–36.0)
MCV: 97.4 fL (ref 80.0–100.0)
Monocytes Absolute: 0.7 K/uL (ref 0.1–1.0)
Monocytes Relative: 12 %
Neutro Abs: 2.1 K/uL (ref 1.7–7.7)
Neutrophils Relative %: 39 %
Platelets: 295 K/uL (ref 150–400)
RBC: 4.66 MIL/uL (ref 4.22–5.81)
RDW: 13.2 % (ref 11.5–15.5)
WBC: 5.4 K/uL (ref 4.0–10.5)
nRBC: 0 % (ref 0.0–0.2)

## 2024-06-16 NOTE — ED Triage Notes (Signed)
 The pt was just discharged he never left the ed and checked back in and reported that the pa told him to check back in if his urine was dark  so he did

## 2024-06-16 NOTE — ED Provider Notes (Signed)
 [place] EMERGENCY DEPARTMENT AT [facility] Provider Note   CSN: [id number] Arrival date & time: 06/16/24  2115     Patient presents with: Leg Pain   Charles Patterson is a 58 y.o. male.    Leg Pain  Patient is a 58 year old male with past medical history significant for hypertension  He presents emergency room today with complaints of bilateral quadricep cramping after doing some body squats he states that these were unweighted squats he did earlier today states that he did in the heat of the day and has had some cramping in his legs.  Denies any falls or injuries.  No history of rhabdomyolysis.  He states he is urinating several times a day and has been clear/normal.  He states he has had improvement in his leg cramps since he has been in the ER waiting room.  He denies any fevers or chills no lightheadedness or dizziness no other associate symptoms.       Prior to Admission medications   Medication Sig Start Date End Date Taking? Authorizing Provider  acetaminophen  (TYLENOL ) 500 MG tablet Take 500 mg by mouth every 6 (six) hours as needed for mild pain.    [provider]  azithromycin  (ZITHROMAX ) 250 MG tablet Take 1 tablet (250 mg total) by mouth daily. Take first 2 tablets together, then 1 every day until finished. 04/08/24   Vicky Charleston, PA-C  benzonatate  (TESSALON ) 100 MG capsule Take 1 capsule (100 mg total) by mouth 2 (two) times daily as needed for cough. 04/08/24   Vicky Charleston, PA-C  diclofenac  Sodium (VOLTAREN ) 1 % GEL Apply 2 g topically 4 (four) times daily. 05/25/24   Jarold Olam HERO, PA-C  fluticasone  (FLONASE ) 50 MCG/ACT nasal spray Place 2 sprays into both nostrils daily for 7 days. 07/20/23 07/27/23  Long, Fonda MATSU, MD  guaiFENesin  (MUCINEX ) 600 MG 12 hr tablet Take 1 tablet (600 mg total) by mouth 2 (two) times daily. 12/17/22   Palumbo, April, MD  ibuprofen  (ADVIL ) 600 MG tablet Take 1 tablet (600 mg total) by mouth every 6 (six) hours as  needed. 11/24/22   Mesner, Selinda, MD  methocarbamol  (ROBAXIN ) 500 MG tablet Take 1 tablet (500 mg total) by mouth 2 (two) times daily. 05/25/24   Jarold Olam HERO, PA-C    Allergies: Patient has no known allergies.    Review of Systems  Updated Vital Signs BP 118/78 (BP Location: Right Arm)   Pulse 88   Temp 98 F (36.7 C) (Oral)   Resp 16   Ht 5' 7 (1.702 m)   Wt 77.1 kg   SpO2 98%   BMI 26.62 kg/m   Physical Exam Vitals and nursing note reviewed.  Constitutional:      General: He is not in acute distress. HENT:     Head: Normocephalic and atraumatic.     Nose: Nose normal.  Eyes:     General: No scleral icterus. Cardiovascular:     Rate and Rhythm: Normal rate and regular rhythm.     Pulses: Normal pulses.     Heart sounds: Normal heart sounds.  Pulmonary:     Effort: Pulmonary effort is normal.  Musculoskeletal:     Cervical back: Normal range of motion.     Right lower leg: No edema.     Left lower leg: No edema.     Comments: Soft compartments of BL quads, calfs, DP/PT pulses nml  Skin:    General: Skin is warm and  dry.     Capillary Refill: Capillary refill takes less than 2 seconds.  Neurological:     Mental Status: He is alert. Mental status is at baseline.  Psychiatric:        Mood and Affect: Mood normal.        Behavior: Behavior normal.     (all labs ordered are listed, but only abnormal results are displayed) Labs Reviewed - No data to display  EKG: None  Radiology: No results found.   Procedures   Medications Ordered in the ED - No data to display                                  Medical Decision Making  Patient is a 58 year old male with past medical history significant for hypertension  He presents emergency room today with complaints of bilateral quadricep cramping after doing some body squats he states that these were unweighted squats he did earlier today states that he did in the heat of the day and has had some cramping in his  legs.  Denies any falls or injuries.  No history of rhabdomyolysis.  He states he is urinating several times a day and has been clear/normal.  He states he has had improvement in his leg cramps since he has been in the ER waiting room.  He denies any fevers or chills no lightheadedness or dizziness no other associate symptoms.  I offered to obtain basic labs to evaluate for electrolytes, CK, urine  Patient states he feels improved and will be discharged home.  I think this is reasonable I recommended that he keep a close eye on his symptoms if he has any cola colored urine, worsening cramps, worsening leg pain any systemic symptoms or any other new or concerning symptoms return immediately to the emergency room for reevaluation.  Patient is well-appearing is tolerating p.o. and ambulatory.  Will discharge home at this time with strict return precautions and recommendations to follow-up with primary care in the next week.   Final diagnoses:  Muscle pain    ED Discharge Orders     None          Neldon Hamp RAMAN, [place] 06/18/24 0819    Gennaro Duwaine CROME, DO 06/18/24 1540

## 2024-06-16 NOTE — Discharge Instructions (Addendum)
 Drink plenty of water, you can apply IcyHot, Bengay, take Tylenol  Motrin  for discomfort. Gentle stretching and massage as well.  If you notice any dark cola colored urine or any other new or concerning symptoms please return the emergency room for reevaluation.

## 2024-06-16 NOTE — ED Triage Notes (Signed)
 Leg cramps for one hour after ??working out

## 2024-06-16 NOTE — ED Provider Triage Note (Signed)
 Emergency Medicine Provider Triage Evaluation Note  Charles Patterson , a 58 y.o. male  was evaluated in triage.  Pt complains of muscle cramping. Was here earlier for the same. States now his urine is dark and he is requesting lab evaluation for possible rhabdomyolysis. States he did 400 reps of squats, push ups and other exercises he does not normally do.   Review of Systems  Positive: Muscle cramping, dark urine Negative: Shortness of breath, chest pain   Physical Exam  BP (!) 144/88 (BP Location: Right Arm)   Pulse 99   Temp 98 F (36.7 C)   Resp 18   SpO2 100%  Gen:   Awake, no distress   Resp:  Normal effort  MSK:   Moves extremities without difficulty    Medical Decision Making  Medically screening exam initiated at 11:16 PM.  Appropriate orders placed.  Charles Patterson was informed that the remainder of the evaluation will be completed by another provider, this initial triage assessment does not replace that evaluation, and the importance of remaining in the ED until their evaluation is complete.     Gennaro Duwaine CROME, DO 06/16/24 2316

## 2024-06-17 LAB — BASIC METABOLIC PANEL WITH GFR
Anion gap: 10 (ref 5–15)
BUN: 17 mg/dL (ref 6–20)
CO2: 26 mmol/L (ref 22–32)
Calcium: 9.5 mg/dL (ref 8.9–10.3)
Chloride: 103 mmol/L (ref 98–111)
Creatinine, Ser: 1.22 mg/dL (ref 0.61–1.24)
GFR, Estimated: >60 mL/min (ref >60)
Glucose, Bld: 99 mg/dL (ref 70–99)
Potassium: 3.9 mmol/L (ref 3.5–5.1)
Sodium: 139 mmol/L (ref 135–145)

## 2024-06-17 LAB — CK: Total CK: 380 U/L (ref 49–397)

## 2024-06-17 NOTE — ED Notes (Signed)
 Called patient several times patient didn't answer

## 2024-06-17 NOTE — ED Notes (Signed)
Pt did not answer when called for vital signs.

## 2024-06-19 ENCOUNTER — Emergency Department (HOSPITAL_COMMUNITY)
Admission: EM | Admit: 2024-06-19 | Discharge: 2024-06-19 | Discharge status: Against Medical Advice | Attending: Emergency Medicine | Admitting: Emergency Medicine

## 2024-06-19 DIAGNOSIS — Z5321 Procedure and treatment not carried out due to patient leaving prior to being seen by health care provider: Secondary | ICD-10-CM | POA: Diagnosis not present

## 2024-06-19 DIAGNOSIS — Z712 Person consulting for explanation of examination or test findings: Secondary | ICD-10-CM | POA: Insufficient documentation

## 2024-06-19 NOTE — ED Notes (Signed)
 Pt last seen storming out of triage upset that ED staff can not provide blood work results from his last visit.

## 2024-06-19 NOTE — ED Notes (Signed)
 Pt upset that he can not get his blood results from last week tonight.

## 2024-08-06 NOTE — Progress Notes (Signed)
 The patient attended a screening event on 06/07/2024 where his screening results were a BP of 111/82 and a blood glucose of 107. At the event the patient did not notate if he has a PCP, insurance or if he is a smoker. Patient did not indicate any SDOH needs.  Per chart review the patient has the [facility] clinic as his PCP, has [employer] and Medicaid for insurance, and has a food and smoking SDOH need. Chart review also indicates the patient has had multiple ED visits in the last 12 months.  [facility] attempted to reach patient (9/29, 9/30, 10/1) but was unable to reach the patient. Voicemails were unable to be left. Letter sent with [facility] and [facility] care clinic PCP resource flyers, [place] and [employer] and [facility] financial assistance information flyers in case needed by patient. An additional follow up will be done in according to the health equity team's protocol.

## 2024-09-09 ENCOUNTER — Other Ambulatory Visit: Payer: Self-pay

## 2024-09-09 ENCOUNTER — Emergency Department (HOSPITAL_COMMUNITY)
Admission: EM | Admit: 2024-09-09 | Discharge: 2024-09-10 | Discharge status: Against Medical Advice | Attending: Emergency Medicine | Admitting: Emergency Medicine

## 2024-09-09 DIAGNOSIS — M7918 Myalgia, other site: Secondary | ICD-10-CM | POA: Insufficient documentation

## 2024-09-09 DIAGNOSIS — Z5321 Procedure and treatment not carried out due to patient leaving prior to being seen by health care provider: Secondary | ICD-10-CM | POA: Diagnosis not present

## 2024-09-09 DIAGNOSIS — J069 Acute upper respiratory infection, unspecified: Secondary | ICD-10-CM | POA: Insufficient documentation

## 2024-09-10 ENCOUNTER — Encounter (HOSPITAL_COMMUNITY): Payer: Self-pay

## 2024-09-10 DIAGNOSIS — J069 Acute upper respiratory infection, unspecified: Secondary | ICD-10-CM | POA: Diagnosis not present

## 2024-09-10 LAB — RESP PANEL BY RT-PCR (RSV, FLU A&B, COVID)  RVPGX2
Influenza A by PCR: NEGATIVE
Influenza B by PCR: NEGATIVE
Resp Syncytial Virus by PCR: NEGATIVE
SARS Coronavirus 2 by RT PCR: NEGATIVE

## 2024-09-10 NOTE — ED Notes (Signed)
 Pt name called to go back to see provider. No response. Pt not seen in waiting room or outside in front at this time.

## 2024-09-10 NOTE — ED Triage Notes (Signed)
 Pt reporting generalized body aches. Pt reports that he worked out and now he doesn't feel well. Pt coming in reporting upper respiratory infection symptoms

## 2024-09-11 ENCOUNTER — Other Ambulatory Visit: Payer: Self-pay

## 2024-09-11 ENCOUNTER — Emergency Department (HOSPITAL_COMMUNITY)
Admission: EM | Admit: 2024-09-11 | Discharge: 2024-09-11 | Disposition: A | Discharge status: Home / Self Care | Attending: Emergency Medicine | Admitting: Emergency Medicine

## 2024-09-11 ENCOUNTER — Encounter (HOSPITAL_COMMUNITY): Payer: Self-pay | Admitting: *Deleted

## 2024-09-11 DIAGNOSIS — Z5321 Procedure and treatment not carried out due to patient leaving prior to being seen by health care provider: Secondary | ICD-10-CM | POA: Diagnosis not present

## 2024-09-11 DIAGNOSIS — R252 Cramp and spasm: Secondary | ICD-10-CM | POA: Insufficient documentation

## 2024-09-11 NOTE — ED Triage Notes (Signed)
 Pt c/o muscle cramps in both legs

## 2024-09-19 ENCOUNTER — Other Ambulatory Visit: Payer: Self-pay

## 2024-09-19 ENCOUNTER — Emergency Department (HOSPITAL_COMMUNITY)
Admission: EM | Admit: 2024-09-19 | Discharge: 2024-09-19 | Discharge status: Against Medical Advice | Attending: Emergency Medicine | Admitting: Emergency Medicine

## 2024-09-19 DIAGNOSIS — Z5321 Procedure and treatment not carried out due to patient leaving prior to being seen by health care provider: Secondary | ICD-10-CM

## 2024-09-19 DIAGNOSIS — M7918 Myalgia, other site: Secondary | ICD-10-CM | POA: Insufficient documentation

## 2024-09-19 DIAGNOSIS — M791 Myalgia, unspecified site: Secondary | ICD-10-CM

## 2024-09-19 DIAGNOSIS — M25519 Pain in unspecified shoulder: Secondary | ICD-10-CM | POA: Diagnosis not present

## 2024-09-19 DIAGNOSIS — M545 Low back pain, unspecified: Secondary | ICD-10-CM | POA: Diagnosis present

## 2024-09-19 DIAGNOSIS — Z5329 Procedure and treatment not carried out because of patient's decision for other reasons: Secondary | ICD-10-CM | POA: Diagnosis not present

## 2024-09-19 LAB — CBG MONITORING, ED: Glucose-Capillary: 95 mg/dL (ref 70–99)

## 2024-09-19 LAB — COMPREHENSIVE METABOLIC PANEL WITH GFR
ALT: 19 U/L (ref 0–44)
AST: 33 U/L (ref 15–41)
Albumin: 4.4 g/dL (ref 3.5–5.0)
Alkaline Phosphatase: 60 U/L (ref 38–126)
Anion gap: 10 (ref 5–15)
BUN: 12 mg/dL (ref 6–20)
CO2: 24 mmol/L (ref 22–32)
Calcium: 9.5 mg/dL (ref 8.9–10.3)
Chloride: 106 mmol/L (ref 98–111)
Creatinine, Ser: 0.91 mg/dL (ref 0.61–1.24)
GFR, Estimated: >60 mL/min (ref >60)
Glucose, Bld: 99 mg/dL (ref 70–99)
Potassium: 3.8 mmol/L (ref 3.5–5.1)
Sodium: 141 mmol/L (ref 135–145)
Total Bilirubin: 0.3 mg/dL (ref 0.0–1.2)
Total Protein: 7.3 g/dL (ref 6.5–8.1)

## 2024-09-19 LAB — CBC
HCT: 40.2 % (ref 39.0–52.0)
Hemoglobin: 13.6 g/dL (ref 13.0–17.0)
MCH: 32 pg (ref 26.0–34.0)
MCHC: 33.8 g/dL (ref 30.0–36.0)
MCV: 94.6 fL (ref 80.0–100.0)
Platelets: 314 K/uL (ref 150–400)
RBC: 4.25 MIL/uL (ref 4.22–5.81)
RDW: 13.3 % (ref 11.5–15.5)
WBC: 5.3 K/uL (ref 4.0–10.5)
nRBC: 0 % (ref 0.0–0.2)

## 2024-09-19 LAB — URINALYSIS, ROUTINE W REFLEX MICROSCOPIC
Bilirubin Urine: NEGATIVE
Glucose, UA: NEGATIVE mg/dL
Hgb urine dipstick: NEGATIVE
Ketones, ur: NEGATIVE mg/dL
Leukocytes,Ua: NEGATIVE
Nitrite: NEGATIVE
Protein, ur: NEGATIVE mg/dL
Specific Gravity, Urine: 1.012 (ref 1.005–1.030)
pH: 8 (ref 5.0–8.0)

## 2024-09-19 LAB — CK TOTAL AND CKMB (NOT AT ARMC)
CK, MB: 11.4 ng/mL — ABNORMAL HIGH (ref 0.5–5.0)
Total CK: 721 U/L — ABNORMAL HIGH (ref 49–397)

## 2024-09-19 MED ORDER — IBUPROFEN 200 MG PO TABS
600.0000 mg | ORAL_TABLET | Freq: Once | ORAL | Status: AC
  Administered 2024-09-19: 600 mg via ORAL
  Filled 2024-09-19: qty 3

## 2024-09-19 MED ORDER — ACETAMINOPHEN 500 MG PO TABS
1000.0000 mg | ORAL_TABLET | Freq: Once | ORAL | Status: AC
  Administered 2024-09-19: 1000 mg via ORAL
  Filled 2024-09-19: qty 2

## 2024-09-19 MED ORDER — CYCLOBENZAPRINE HCL 10 MG PO TABS
10.0000 mg | ORAL_TABLET | Freq: Once | ORAL | Status: AC
  Administered 2024-09-19: 10 mg via ORAL
  Filled 2024-09-19: qty 1

## 2024-09-19 NOTE — ED Provider Notes (Signed)
 [place] EMERGENCY DEPARTMENT AT [facility] Provider Note   CSN: [id number] Arrival date & time: 09/19/24  1620     Patient presents with: Pain   Charles Patterson is a 58 y.o. male.   Patient is a 58 year old male with no significant past medical history presenting to the emergency department with muscle pain.  Patient states that he was working out 2 days ago and thinks that he may have overdone it and since then he is having significant pain in his shoulders and low back.  He denies any associated numbness or weakness, dark or bloody stools.  He states that he has been taking Motrin  without significant relief.  The history is provided by the patient.       Prior to Admission medications   Medication Sig Start Date End Date Taking? Authorizing Provider  acetaminophen  (TYLENOL ) 500 MG tablet Take 500 mg by mouth every 6 (six) hours as needed for mild pain.    [provider]  azithromycin  (ZITHROMAX ) 250 MG tablet Take 1 tablet (250 mg total) by mouth daily. Take first 2 tablets together, then 1 every day until finished. 04/08/24   Vicky Charleston, PA-C  benzonatate  (TESSALON ) 100 MG capsule Take 1 capsule (100 mg total) by mouth 2 (two) times daily as needed for cough. 04/08/24   Vicky Charleston, PA-C  diclofenac  Sodium (VOLTAREN ) 1 % GEL Apply 2 g topically 4 (four) times daily. 05/25/24   Jarold Olam HERO, PA-C  fluticasone  (FLONASE ) 50 MCG/ACT nasal spray Place 2 sprays into both nostrils daily for 7 days. 07/20/23 07/27/23  Long, Fonda MATSU, MD  guaiFENesin  (MUCINEX ) 600 MG 12 hr tablet Take 1 tablet (600 mg total) by mouth 2 (two) times daily. 12/17/22   Palumbo, April, MD  ibuprofen  (ADVIL ) 600 MG tablet Take 1 tablet (600 mg total) by mouth every 6 (six) hours as needed. 11/24/22   Mesner, Selinda, MD  methocarbamol  (ROBAXIN ) 500 MG tablet Take 1 tablet (500 mg total) by mouth 2 (two) times daily. 05/25/24   Jarold Olam HERO, PA-C    Allergies: Patient has no known  allergies.    Review of Systems  Updated Vital Signs BP (!) 177/97 (BP Location: Left Arm)   Pulse (!) 113   Temp 97.6 F (36.4 C) (Oral)   Resp 16   Ht 5' 7 (1.702 m)   Wt 81.6 kg   SpO2 100%   BMI 28.19 kg/m   Physical Exam Vitals and nursing note reviewed.  Constitutional:      General: He is not in acute distress.    Appearance: Normal appearance.  HENT:     Head: Normocephalic and atraumatic.     Nose: Nose normal.     Mouth/Throat:     Mouth: Mucous membranes are moist.  Eyes:     Extraocular Movements: Extraocular movements intact.     Conjunctiva/sclera: Conjunctivae normal.  Cardiovascular:     Rate and Rhythm: Normal rate and regular rhythm.     Heart sounds: Normal heart sounds.  Pulmonary:     Effort: Pulmonary effort is normal.     Breath sounds: Normal breath sounds.  Abdominal:     General: Abdomen is flat.     Palpations: Abdomen is soft.     Tenderness: There is no abdominal tenderness.  Musculoskeletal:        General: Normal range of motion.     Cervical back: Normal range of motion.     Comments: No midline  back tenderness, bilateral lumbar paraspinal muscle tenderness to palpation No reproducible tenderness in bilateral upper or lower extremities  Skin:    General: Skin is warm and dry.  Neurological:     General: No focal deficit present.     Mental Status: He is alert and oriented to person, place, and time.     Gait: Gait normal.  Psychiatric:        Mood and Affect: Mood normal.        Behavior: Behavior normal.     (all labs ordered are listed, but only abnormal results are displayed) Labs Reviewed  URINALYSIS, ROUTINE W REFLEX MICROSCOPIC - Abnormal; Notable for the following components:      Result Value   APPearance HAZY (*)    All other components within normal limits  CK TOTAL AND CKMB (NOT AT [facility]) - Abnormal; Notable for the following components:   Total CK 721 (*)    CK, MB 11.4 (*)    All other components within  normal limits  COMPREHENSIVE METABOLIC PANEL WITH GFR  CBC  CBG MONITORING, ED    EKG: EKG Interpretation Date/Time:  Sunday September 19 2024 17:25:11 EST Ventricular Rate:  112 PR Interval:  173 QRS Duration:  84 QT Interval:  314 QTC Calculation: 429 R Axis:   59  Text Interpretation: Sinus tachycardia Consider left ventricular hypertrophy Baseline wander in lead(s) V3 Partial missing lead(s): V3 No significant change since last tracing Confirmed by Ellouise Fine ([id number]) on 09/19/2024 6:57:31 PM  Radiology: No results found.   Procedures   Medications Ordered in the ED  acetaminophen  (TYLENOL ) tablet 1,000 mg (1,000 mg Oral Given 09/19/24 1915)  cyclobenzaprine (FLEXERIL) tablet 10 mg (10 mg Oral Given 09/19/24 1915)  ibuprofen  (ADVIL ) tablet 600 mg (600 mg Oral Given 09/19/24 1914)                                    Medical Decision Making This patient presents to the ED with chief complaint(s) of muscle aches with no pertinent past medical history which further complicates the presenting complaint. The complaint involves an extensive differential diagnosis and also carries with it a high risk of complications and morbidity.    The differential diagnosis includes muscle strain or spasm, dehydration, electrolyte abnormality, rhabdo, no significant trauma or bony tenderness making fracture or dislocation unlikely  Additional history obtained: Additional history obtained from N/A Records reviewed N/A  ED Course and Reassessment: Patient was initially evaluated in triage was tachycardic and otherwise hemodynamically stable.  He had labs initiated, CBC and CMP are within normal range, CK and urine were pending at this time.  The patient was given pain control and then eloped from the emergency department prior to completion of his workup.  Independent labs interpretation:  The following labs were independently interpreted: Mildly elevated CK otherwise within normal  range  Independent visualization of imaging: - N/A  Consultation: - Consulted or discussed management/test interpretation w/ external professional: N/A  Consideration for admission or further workup: patient eloped prior to completion of work up  Social Determinants of health: N/A    Amount and/or Complexity of Data Reviewed Labs: ordered.  Risk OTC drugs. Prescription drug management.       Final diagnoses:  Eloped from emergency department  Myalgia    ED Discharge Orders     None          Ellouise Fine  K, DO 09/19/24 2024

## 2024-09-19 NOTE — ED Notes (Signed)
 Pt states that he has to go and will take a rain check on that paperwork.

## 2024-09-19 NOTE — ED Triage Notes (Signed)
 Patient to ED by POV for generalized cramping. Reports working out and has pain all over.

## 2024-09-30 ENCOUNTER — Encounter (HOSPITAL_COMMUNITY): Payer: Self-pay | Admitting: Emergency Medicine

## 2024-09-30 ENCOUNTER — Other Ambulatory Visit: Payer: Self-pay

## 2024-09-30 ENCOUNTER — Emergency Department (HOSPITAL_COMMUNITY)
Admission: EM | Admit: 2024-09-30 | Discharge: 2024-09-30 | Disposition: A | Discharge status: Home / Self Care | Attending: Emergency Medicine | Admitting: Emergency Medicine

## 2024-09-30 ENCOUNTER — Emergency Department (HOSPITAL_COMMUNITY)

## 2024-09-30 DIAGNOSIS — R519 Headache, unspecified: Secondary | ICD-10-CM | POA: Insufficient documentation

## 2024-09-30 MED ORDER — METHOCARBAMOL 500 MG PO TABS
500.0000 mg | ORAL_TABLET | Freq: Once | ORAL | Status: AC
  Administered 2024-09-30: 500 mg via ORAL
  Filled 2024-09-30: qty 1

## 2024-09-30 MED ORDER — BUTALBITAL-APAP-CAFFEINE 50-325-40 MG PO TABS
2.0000 | ORAL_TABLET | Freq: Once | ORAL | Status: AC
  Administered 2024-09-30: 2 via ORAL
  Filled 2024-09-30: qty 2

## 2024-09-30 NOTE — ED Provider Notes (Signed)
 [place] EMERGENCY DEPARTMENT AT [facility] Provider Note   CSN: [id number] Arrival date & time: 09/30/24  0003     Patient presents with: Headache   Charles Patterson is a 58 y.o. male.   The patient presents with a headache that began around 6:00 PM. The headache is described as a pressure sensation located at the back of the eyes. The patient reports infrequent headaches and attributes the current episode to stress due to multiple ongoing commitments, including work deadlines and family gatherings. The patient denies any recent trauma, falls, or car accidents. There is no history of allergies or previous problems with similar exposures, including the use of henna dye for the beard, which was applied shortly before the headache onset. The patient denies any recent fevers. The headache initially improved with the use of Goody's powder but recurred. The patient has tried over-the-counter muscle relaxers and practices meditation and stretching for relief. There is no history of migraines triggered by smells, and the patient denies any recent illness or exposure to sick contacts. History was obtained from the patient.     Headache      Prior to Admission medications   Medication Sig Start Date End Date Taking? Authorizing Provider  acetaminophen  (TYLENOL ) 500 MG tablet Take 500 mg by mouth every 6 (six) hours as needed for mild pain.    [provider]  azithromycin  (ZITHROMAX ) 250 MG tablet Take 1 tablet (250 mg total) by mouth daily. Take first 2 tablets together, then 1 every day until finished. 04/08/24   Vicky Charleston, PA-C  benzonatate  (TESSALON ) 100 MG capsule Take 1 capsule (100 mg total) by mouth 2 (two) times daily as needed for cough. 04/08/24   Vicky Charleston, PA-C  diclofenac  Sodium (VOLTAREN ) 1 % GEL Apply 2 g topically 4 (four) times daily. 05/25/24   Jarold Olam HERO, PA-C  fluticasone  (FLONASE ) 50 MCG/ACT nasal spray Place 2 sprays into both nostrils  daily for 7 days. 07/20/23 07/27/23  Long, Fonda MATSU, MD  guaiFENesin  (MUCINEX ) 600 MG 12 hr tablet Take 1 tablet (600 mg total) by mouth 2 (two) times daily. 12/17/22   Palumbo, April, MD  ibuprofen  (ADVIL ) 600 MG tablet Take 1 tablet (600 mg total) by mouth every 6 (six) hours as needed. 11/24/22   Antha Niday, Selinda, MD  methocarbamol  (ROBAXIN ) 500 MG tablet Take 1 tablet (500 mg total) by mouth 2 (two) times daily. 05/25/24   Jarold Olam HERO, PA-C    Allergies: Patient has no known allergies.    Review of Systems  Neurological:  Positive for headaches.    Updated Vital Signs BP (!) 143/79 (BP Location: Right Arm)   Pulse (!) 106   Temp 98.1 F (36.7 C)   Resp 20   SpO2 100%   Physical Exam Vitals and nursing note reviewed.  Constitutional:      Appearance: He is well-developed.  HENT:     Head: Normocephalic and atraumatic.  Cardiovascular:     Rate and Rhythm: Normal rate.  Pulmonary:     Effort: Pulmonary effort is normal. No respiratory distress.  Abdominal:     General: There is no distension.  Musculoskeletal:        General: Normal range of motion.     Cervical back: Normal range of motion.  Neurological:     Mental Status: He is alert.     Comments: No altered mental status, able to give full seemingly accurate history.  Face is symmetric, EOM's intact,  pupils equal and reactive, vision intact, tongue and uvula midline without deviation. Upper and Lower extremity motor 5/5, intact pain perception in distal extremities, 2+ reflexes in biceps, patella and achilles tendons. Able to perform finger to nose normal with both hands. Walks without assistance or evident ataxia.       (all labs ordered are listed, but only abnormal results are displayed) Labs Reviewed - No data to display  EKG: None  Radiology: CT Head Wo Contrast Result Date: 09/30/2024 EXAM: CT HEAD WITHOUT CONTRAST 09/30/2024 01:33:27 AM TECHNIQUE: CT of the head was performed without the administration  of intravenous contrast. Automated exposure control, iterative reconstruction, and/or weight based adjustment of the mA/kV was utilized to reduce the radiation dose to as low as reasonably achievable. COMPARISON: 12/23/2021 CLINICAL HISTORY: Headache, increasing frequency or severity. FINDINGS: BRAIN AND VENTRICLES: Mild atrophic change. No acute hemorrhage. No evidence of acute infarct. No extra-axial collection. No mass effect or midline shift. ORBITS: No acute abnormality. SINUSES: No acute abnormality. SOFT TISSUES AND SKULL: No acute soft tissue abnormality. No skull fracture. IMPRESSION: 1. No acute intracranial abnormality. 2. Mild cerebral atrophy, unchanged from prior exam Electronically signed by: Oneil Devonshire MD 09/30/2024 01:36 AM EST RP Workstation: [id number]     Procedures   Medications Ordered in the ED  butalbital -acetaminophen -caffeine  (FIORICET ) 50-325-40 MG per tablet 2 tablet (2 tablets Oral Given 09/30/24 0147)  methocarbamol  (ROBAXIN ) tablet 500 mg (500 mg Oral Given 09/30/24 0146)                                    Medical Decision Making Amount and/or Complexity of Data Reviewed Radiology: ordered.  Risk Prescription drug management.   CT scan ok. HA improving. Neuro intact. Ambulates well. No other red flags to suggest need for further imagine/hospitalization.   Final diagnoses:  Nonintractable headache, unspecified chronicity pattern, unspecified headache type    ED Discharge Orders     None          Lashun Ramseyer, Selinda, MD 09/30/24 [id number]

## 2024-09-30 NOTE — ED Triage Notes (Signed)
 Pt arrive POV c/o Headache that started today, pt denies any nausea or vomiting. Pt is AO x 4 no neuro deficit noticed. Pt denies taking anything for pain relief pta to ED.

## 2024-10-05 ENCOUNTER — Emergency Department (HOSPITAL_COMMUNITY)
Admission: EM | Admit: 2024-10-05 | Discharge: 2024-10-06 | Disposition: A | Discharge status: Home / Self Care | Attending: Emergency Medicine | Admitting: Emergency Medicine

## 2024-10-05 ENCOUNTER — Other Ambulatory Visit: Payer: Self-pay

## 2024-10-05 DIAGNOSIS — R0981 Nasal congestion: Secondary | ICD-10-CM | POA: Insufficient documentation

## 2024-10-05 DIAGNOSIS — I10 Essential (primary) hypertension: Secondary | ICD-10-CM | POA: Insufficient documentation

## 2024-10-05 NOTE — ED Triage Notes (Signed)
 Pt c.o snifling, watery eyes, pt using nasal spray with some relief.

## 2024-10-06 NOTE — ED Provider Notes (Signed)
 [place] EM[facility] AT [facility] Provider Note   CSN: [id number] Arrival date & time: 10/05/24  2206     Patient presents with: URI   Charles Patterson is a 58 y.o. male.   The history is provided by the patient and medical records.  URI Presenting symptoms: congestion    58 y.o. M with hx of HTN here with nasal congestion and watery eyes for the last few days.   Denies any sick contacts.  No fever/chills.  Has been using nasal spray with some relief.  Prior to Admission medications   Medication Sig Start Date End Date Taking? Authorizing Provider  acetaminophen  (TYLENOL ) 500 MG tablet Take 500 mg by mouth every 6 (six) hours as needed for mild pain.    [provider]  azithromycin  (ZITHROMAX ) 250 MG tablet Take 1 tablet (250 mg total) by mouth daily. Take first 2 tablets together, then 1 every day until finished. 04/08/24   Vicky Charleston, PA-C  benzonatate  (TESSALON ) 100 MG capsule Take 1 capsule (100 mg total) by mouth 2 (two) times daily as needed for cough. 04/08/24   Vicky Charleston, PA-C  diclofenac  Sodium (VOLTAREN ) 1 % GEL Apply 2 g topically 4 (four) times daily. 05/25/24   Jarold Olam HERO, PA-C  fluticasone  (FLONASE ) 50 MCG/ACT nasal spray Place 2 sprays into both nostrils daily for 7 days. 07/20/23 07/27/23  Long, Fonda MATSU, MD  guaiFENesin  (MUCINEX ) 600 MG 12 hr tablet Take 1 tablet (600 mg total) by mouth 2 (two) times daily. 12/17/22   Palumbo, April, MD  ibuprofen  (ADVIL ) 600 MG tablet Take 1 tablet (600 mg total) by mouth every 6 (six) hours as needed. 11/24/22   Mesner, Selinda, MD  methocarbamol  (ROBAXIN ) 500 MG tablet Take 1 tablet (500 mg total) by mouth 2 (two) times daily. 05/25/24   Jarold Olam HERO, PA-C    Allergies: Patient has no known allergies.    Review of Systems  HENT:  Positive for congestion.   All other systems reviewed and are negative.   Updated Vital Signs BP (!) 150/89 (BP Location: Right Arm)   Pulse 89   Temp  97.8 F (36.6 C)   Resp 19   SpO2 96%   Physical Exam Vitals and nursing note reviewed.  Constitutional:      Appearance: He is well-developed.     Comments: Sleeping soundly, awoken for exam  HENT:     Head: Normocephalic and atraumatic.     Nose:     Comments: Minor nasal congestion Eyes:     Conjunctiva/sclera: Conjunctivae normal.     Pupils: Pupils are equal, round, and reactive to light.  Cardiovascular:     Rate and Rhythm: Normal rate and regular rhythm.     Heart sounds: Normal heart sounds.  Pulmonary:     Effort: Pulmonary effort is normal.     Breath sounds: Normal breath sounds. No wheezing or rhonchi.     Comments: Lungs CTAB Abdominal:     General: Bowel sounds are normal.     Palpations: Abdomen is soft.  Musculoskeletal:        General: Normal range of motion.     Cervical back: Normal range of motion.  Skin:    General: Skin is warm and dry.  Neurological:     Mental Status: He is oriented to person, place, and time.     (all labs ordered are listed, but only abnormal results are displayed) Labs Reviewed - No data  to display  EKG: None  Radiology: No results found.   Procedures   Medications Ordered in the ED - No data to display                                  Medical Decision Making  58 year old male presenting to the ED with nasal congestion.  Denies fever or sick contacts.  Afebrile, non-toxic here, NAD.  Minor congestion on exam but lungs CTAB.  Consider viral testing but unlikely to change acute management.  Stable for discharge with continued symptomatic care.  Can return here for new concerns.  Final diagnoses:  Nasal congestion    ED Discharge Orders     None          Jarold Olam HERO, PA-C 10/06/24 [id number]    Jerral Meth, MD 10/06/24 [phone]

## 2024-10-27 ENCOUNTER — Other Ambulatory Visit: Payer: Self-pay

## 2024-10-27 ENCOUNTER — Emergency Department (HOSPITAL_COMMUNITY)
Admission: EM | Admit: 2024-10-27 | Discharge: 2024-10-28 | Disposition: A | Discharge status: Home / Self Care | Attending: Emergency Medicine | Admitting: Emergency Medicine

## 2024-10-27 ENCOUNTER — Encounter (HOSPITAL_COMMUNITY): Payer: Self-pay | Admitting: Emergency Medicine

## 2024-10-27 DIAGNOSIS — F430 Acute stress reaction: Secondary | ICD-10-CM | POA: Diagnosis present

## 2024-10-27 DIAGNOSIS — F419 Anxiety disorder, unspecified: Secondary | ICD-10-CM | POA: Diagnosis not present

## 2024-10-27 DIAGNOSIS — Z5900 Homelessness unspecified: Secondary | ICD-10-CM | POA: Insufficient documentation

## 2024-10-27 NOTE — ED Triage Notes (Signed)
 Pt states he is frustrated and needs a place to sit for a few hours. Denies any complaints. No SI/HI

## 2024-10-27 NOTE — ED Triage Notes (Addendum)
 Pt POV d/t felling anxious for about 7-8 hours.  Pt is homeless

## 2024-10-28 NOTE — ED Provider Notes (Signed)
 " [place] EMERGENCY DEPARTMENT AT [facility] Provider Note   CSN: [id number] Arrival date & time: 10/27/24  2217     Patient presents with: Anxiety   Charles Patterson is a 58 y.o. male.   58 year old male presents with complaint of feeling stressed.  Patient reports symptoms secondary to a person he is having to work with.  He denies suicidal or homicidal ideation.  Requesting some place to sleep for a few hours.       Prior to Admission medications  Medication Sig Start Date End Date Taking? Authorizing Provider  acetaminophen  (TYLENOL ) 500 MG tablet Take 500 mg by mouth every 6 (six) hours as needed for mild pain.    [provider]  azithromycin  (ZITHROMAX ) 250 MG tablet Take 1 tablet (250 mg total) by mouth daily. Take first 2 tablets together, then 1 every day until finished. 04/08/24   Vicky Charleston, PA-C  benzonatate  (TESSALON ) 100 MG capsule Take 1 capsule (100 mg total) by mouth 2 (two) times daily as needed for cough. 04/08/24   Vicky Charleston, PA-C  diclofenac  Sodium (VOLTAREN ) 1 % GEL Apply 2 g topically 4 (four) times daily. 05/25/24   Jarold Olam HERO, PA-C  fluticasone  (FLONASE ) 50 MCG/ACT nasal spray Place 2 sprays into both nostrils daily for 7 days. 07/20/23 07/27/23  Long, Fonda MATSU, MD  guaiFENesin  (MUCINEX ) 600 MG 12 hr tablet Take 1 tablet (600 mg total) by mouth 2 (two) times daily. 12/17/22   Palumbo, April, MD  ibuprofen  (ADVIL ) 600 MG tablet Take 1 tablet (600 mg total) by mouth every 6 (six) hours as needed. 11/24/22   Mesner, Selinda, MD  methocarbamol  (ROBAXIN ) 500 MG tablet Take 1 tablet (500 mg total) by mouth 2 (two) times daily. 05/25/24   Jarold Olam HERO, PA-C    Allergies: Patient has no known allergies.    Review of Systems Negative except as per HPI Updated Vital Signs BP 114/77   Pulse 70   Temp 97.8 F (36.6 C) (Oral)   Resp 18   Wt 81.6 kg   SpO2 95%   BMI 28.18 kg/m   Physical Exam Vitals and nursing note reviewed.   Constitutional:      General: He is not in acute distress.    Appearance: He is well-developed. He is not diaphoretic.  HENT:     Head: Normocephalic and atraumatic.  Pulmonary:     Effort: Pulmonary effort is normal.  Neurological:     Mental Status: He is alert and oriented to person, place, and time.  Psychiatric:        Behavior: Behavior normal.     (all labs ordered are listed, but only abnormal results are displayed) Labs Reviewed - No data to display  EKG: None  Radiology: No results found.   Procedures   Medications Ordered in the ED - No data to display                                  Medical Decision Making  58 year old male presents as above.  He states that he does not have any concerns currently, is not suicidal or homicidal and would just like to sleep for a few hours.     Final diagnoses:  None    ED Discharge Orders     None          Beverley Leita DELENA, PA-C 10/28/24 [id number]  Jerral Meth, MD 10/28/24 [phone]  "

## 2024-11-02 ENCOUNTER — Emergency Department (HOSPITAL_COMMUNITY)
Admission: EM | Admit: 2024-11-02 | Discharge: 2024-11-03 | Discharge status: Against Medical Advice | Attending: Emergency Medicine | Admitting: Emergency Medicine

## 2024-11-02 ENCOUNTER — Other Ambulatory Visit: Payer: Self-pay

## 2024-11-02 DIAGNOSIS — R5383 Other fatigue: Secondary | ICD-10-CM | POA: Diagnosis not present

## 2024-11-02 DIAGNOSIS — R059 Cough, unspecified: Secondary | ICD-10-CM | POA: Insufficient documentation

## 2024-11-02 DIAGNOSIS — Z5321 Procedure and treatment not carried out due to patient leaving prior to being seen by health care provider: Secondary | ICD-10-CM | POA: Diagnosis not present

## 2024-11-02 DIAGNOSIS — R0981 Nasal congestion: Secondary | ICD-10-CM | POA: Diagnosis not present

## 2024-11-02 NOTE — ED Triage Notes (Signed)
 Quick triage note: Pt reports flu sx times 2 days. Denies pain,

## 2024-11-03 ENCOUNTER — Other Ambulatory Visit: Payer: Self-pay

## 2024-11-03 ENCOUNTER — Emergency Department (HOSPITAL_COMMUNITY)
Admission: EM | Admit: 2024-11-03 | Discharge: 2024-11-03 | Discharge status: Against Medical Advice | Attending: Emergency Medicine | Admitting: Emergency Medicine

## 2024-11-03 DIAGNOSIS — R059 Cough, unspecified: Secondary | ICD-10-CM | POA: Insufficient documentation

## 2024-11-03 DIAGNOSIS — Z5321 Procedure and treatment not carried out due to patient leaving prior to being seen by health care provider: Secondary | ICD-10-CM | POA: Insufficient documentation

## 2024-11-03 DIAGNOSIS — R5383 Other fatigue: Secondary | ICD-10-CM | POA: Insufficient documentation

## 2024-11-03 DIAGNOSIS — J069 Acute upper respiratory infection, unspecified: Secondary | ICD-10-CM | POA: Insufficient documentation

## 2024-11-03 DIAGNOSIS — R0981 Nasal congestion: Secondary | ICD-10-CM | POA: Insufficient documentation

## 2024-11-03 LAB — RESP PANEL BY RT-PCR (RSV, FLU A&B, COVID)  RVPGX2
Influenza A by PCR: NEGATIVE
Influenza B by PCR: NEGATIVE
Resp Syncytial Virus by PCR: NEGATIVE
SARS Coronavirus 2 by RT PCR: NEGATIVE

## 2024-11-03 NOTE — ED Notes (Signed)
 Patient stated that he had to work at 12 and could not wait any longer. [place] he would return later or if his symptoms worsen. Moved OTF.

## 2024-11-03 NOTE — ED Notes (Signed)
 Patient left.

## 2024-11-03 NOTE — ED Notes (Signed)
 Patient has been MIA, and did not answer when name was called for vitals.

## 2024-11-03 NOTE — ED Triage Notes (Signed)
 Patient reports fatigue , nasal congestion , occasional dry cough for several days . Respirations unlabored/afebrile .

## 2024-11-03 NOTE — ED Notes (Addendum)
 Pt walking around lobby talking to everyone. Pt appears in no apparent distress.

## 2024-11-03 NOTE — ED Triage Notes (Addendum)
 Flu symptoms checked in this morning but left. Is now back. Respiratory swab from this morning negative. Bilateral BKA.

## 2024-12-03 NOTE — Progress Notes (Signed)
 The patient attended a screening event on 06/07/2024 where his screening results were a BP of 111/82 and a blood glucose of 107. At the event the patient did not notate if he has a PCP, insurance or if he is a smoker. Patient did not indicate any SDOH needs.   Per chart review the patient has the [facility] clinic as his PCP, has [employer] and Medicaid for insurance, and has a food and smoking SDOH need. Chart review also indicates the patient has had multiple ED visits in the last 12 months.   CH[facility] attempted to reach patient for 60 day f/u but was unable to reach the patient. Voicemails were unable to be left. Letter sent with [facility] and [facility] PCP resource flyers, [place] and [employer] and [facility] financial assistance information flyers in case needed by patient. An additional follow up will be done in according to the health equity team's protocol.
